# Patient Record
Sex: Male | Born: 1950
Health system: Southern US, Community
[De-identification: ages and names within clinical notes are randomized; demographics above are authoritative.]

## PROBLEM LIST (undated history)

## (undated) DIAGNOSIS — C61 Malignant neoplasm of prostate: Secondary | ICD-10-CM

## (undated) DIAGNOSIS — K219 Gastro-esophageal reflux disease without esophagitis: Secondary | ICD-10-CM

## (undated) DIAGNOSIS — N200 Calculus of kidney: Secondary | ICD-10-CM

## (undated) DIAGNOSIS — R399 Unspecified symptoms and signs involving the genitourinary system: Secondary | ICD-10-CM

## (undated) HISTORY — PX: OTHER SURGICAL HISTORY: SHX169

## (undated) HISTORY — PX: FINGER SURGERY: SHX640

## (undated) HISTORY — PX: CYSTOSCOPY W/ URETERAL STENT PLACEMENT: SHX1429

---

## 2001-01-14 ENCOUNTER — Ambulatory Visit (HOSPITAL_COMMUNITY): Admission: RE | Admit: 2001-01-14 | Discharge: 2001-01-14 | Payer: Self-pay | Admitting: Internal Medicine

## 2001-01-14 ENCOUNTER — Encounter: Payer: Self-pay | Admitting: Gastroenterology

## 2001-01-22 ENCOUNTER — Other Ambulatory Visit: Admission: RE | Admit: 2001-01-22 | Discharge: 2001-01-22 | Payer: Self-pay | Admitting: Gastroenterology

## 2001-02-02 ENCOUNTER — Ambulatory Visit (HOSPITAL_COMMUNITY): Admission: RE | Admit: 2001-02-02 | Discharge: 2001-02-02 | Payer: Self-pay | Admitting: Gastroenterology

## 2001-02-02 ENCOUNTER — Encounter: Payer: Self-pay | Admitting: Gastroenterology

## 2003-05-08 ENCOUNTER — Encounter: Payer: Self-pay | Admitting: Internal Medicine

## 2003-05-08 ENCOUNTER — Ambulatory Visit (HOSPITAL_COMMUNITY): Admission: RE | Admit: 2003-05-08 | Discharge: 2003-05-08 | Payer: Self-pay | Admitting: Internal Medicine

## 2003-05-19 ENCOUNTER — Ambulatory Visit (HOSPITAL_COMMUNITY): Admission: RE | Admit: 2003-05-19 | Discharge: 2003-05-19 | Payer: Self-pay | Admitting: Internal Medicine

## 2003-05-19 ENCOUNTER — Encounter: Payer: Self-pay | Admitting: Internal Medicine

## 2011-07-19 ENCOUNTER — Emergency Department (HOSPITAL_BASED_OUTPATIENT_CLINIC_OR_DEPARTMENT_OTHER)
Admission: EM | Admit: 2011-07-19 | Discharge: 2011-07-20 | Disposition: A | Discharge status: Home / Self Care | Payer: PRIVATE HEALTH INSURANCE | Attending: Emergency Medicine | Admitting: Emergency Medicine

## 2011-07-19 ENCOUNTER — Encounter: Payer: Self-pay | Admitting: *Deleted

## 2011-07-19 ENCOUNTER — Emergency Department (INDEPENDENT_AMBULATORY_CARE_PROVIDER_SITE_OTHER): Payer: PRIVATE HEALTH INSURANCE

## 2011-07-19 DIAGNOSIS — N200 Calculus of kidney: Secondary | ICD-10-CM | POA: Insufficient documentation

## 2011-07-19 DIAGNOSIS — K219 Gastro-esophageal reflux disease without esophagitis: Secondary | ICD-10-CM | POA: Insufficient documentation

## 2011-07-19 DIAGNOSIS — N1330 Unspecified hydronephrosis: Secondary | ICD-10-CM

## 2011-07-19 DIAGNOSIS — K7689 Other specified diseases of liver: Secondary | ICD-10-CM

## 2011-07-19 DIAGNOSIS — N201 Calculus of ureter: Secondary | ICD-10-CM

## 2011-07-19 DIAGNOSIS — R911 Solitary pulmonary nodule: Secondary | ICD-10-CM

## 2011-07-19 DIAGNOSIS — J984 Other disorders of lung: Secondary | ICD-10-CM

## 2011-07-19 DIAGNOSIS — R109 Unspecified abdominal pain: Secondary | ICD-10-CM

## 2011-07-19 HISTORY — DX: Gastro-esophageal reflux disease without esophagitis: K21.9

## 2011-07-19 LAB — URINALYSIS, ROUTINE W REFLEX MICROSCOPIC
Ketones, ur: 15 mg/dL — AB
Leukocytes, UA: NEGATIVE
Nitrite: NEGATIVE
Protein, ur: 30 mg/dL — AB
Urobilinogen, UA: 1 mg/dL (ref 0.0–1.0)

## 2011-07-19 LAB — COMPREHENSIVE METABOLIC PANEL
AST: 16 U/L (ref 0–37)
Alkaline Phosphatase: 66 U/L (ref 39–117)
BUN: 20 mg/dL (ref 6–23)
CO2: 24 mEq/L (ref 19–32)
Chloride: 101 mEq/L (ref 96–112)
Creatinine, Ser: 1.1 mg/dL (ref 0.50–1.35)
GFR calc non Af Amer: >60 mL/min (ref >60)
Potassium: 3.6 mEq/L (ref 3.5–5.1)
Total Bilirubin: 0.6 mg/dL (ref 0.3–1.2)

## 2011-07-19 LAB — DIFFERENTIAL
Basophils Absolute: 0 10*3/uL (ref 0.0–0.1)
Basophils Relative: 0 % (ref 0–1)
Eosinophils Absolute: 0.1 10*3/uL (ref 0.0–0.7)
Eosinophils Relative: 1 % (ref 0–5)
Lymphs Abs: 3.3 10*3/uL (ref 0.7–4.0)
Neutrophils Relative %: 56 % (ref 43–77)

## 2011-07-19 LAB — URINE MICROSCOPIC-ADD ON

## 2011-07-19 LAB — CBC
MCH: 31.3 pg (ref 26.0–34.0)
Platelets: 214 10*3/uL (ref 150–400)
RBC: 4.47 MIL/uL (ref 4.22–5.81)
RDW: 12.1 % (ref 11.5–15.5)

## 2011-07-19 MED ORDER — TAMSULOSIN HCL 0.4 MG PO CAPS: 0.4000 mg | ORAL_CAPSULE | Freq: Every day | ORAL | Status: DC

## 2011-07-19 MED ORDER — KETOROLAC TROMETHAMINE 30 MG/ML IJ SOLN
30.0000 mg | Freq: Once | INTRAMUSCULAR | Status: AC
  Administered 2011-07-19: 30 mg via INTRAVENOUS
  Filled 2011-07-19: qty 1

## 2011-07-19 MED ORDER — HYDROCODONE-ACETAMINOPHEN 5-500 MG PO TABS: 1.0000 | ORAL_TABLET | Freq: Four times a day (QID) | ORAL | Status: AC | PRN

## 2011-07-19 MED ORDER — IBUPROFEN 600 MG PO TABS: 600.0000 mg | ORAL_TABLET | Freq: Four times a day (QID) | ORAL | Status: DC | PRN

## 2011-07-19 MED ORDER — ONDANSETRON HCL 4 MG/2ML IJ SOLN
4.0000 mg | Freq: Once | INTRAMUSCULAR | Status: AC
  Administered 2011-07-19: 4 mg via INTRAVENOUS
  Filled 2011-07-19: qty 2

## 2011-07-19 MED ORDER — IBUPROFEN 600 MG PO TABS: 600.0000 mg | ORAL_TABLET | Freq: Four times a day (QID) | ORAL | Status: AC | PRN

## 2011-07-19 MED ORDER — MORPHINE SULFATE 4 MG/ML IJ SOLN
4.0000 mg | Freq: Once | INTRAMUSCULAR | Status: AC
  Administered 2011-07-19: 4 mg via INTRAVENOUS
  Filled 2011-07-19: qty 1

## 2011-07-19 NOTE — ED Provider Notes (Signed)
History  Scribed for Dr. Hyman Hopes, the patient was seen in room 14. The chart was scribed by Gilman Schmidt. The patients care was started at 22:30.   CSN: [id number] Arrival date & time: 07/19/2011  9:39 PM  Chief Complaint  Patient presents with  . Flank Pain   HPI Allen Tran is a 60 y.o. male who presents to the Emergency Department complaining of right sided flank pain. Pt reports that pain is intermittent and is described as a spasm. Denies any symptoms of nausea, vomiting, dysuria, hematuria, or urine frequency. Denies hematuria/dysuria/freq/urgency There are no other associated symptoms and no other alleviating or aggravating factors. No h/o renal stones. No h/o AAA  HPI ELEMENTS: Location: right side flank  Duration: consistent since onset Timing: intermittent  Severity: 10/10--> 3/10 after toradol Context: as above  Associated symptoms: denies any symptoms of nausea, vomiting, dysuria, hematuria, or urine frequency.  PAST MEDICAL HISTORY:  Past Medical History  Diagnosis Date  . GERD (gastroesophageal reflux disease)      PAST SURGICAL HISTORY:  History reviewed. No pertinent past surgical history.   MEDICATIONS:  Previous Medications   ESOMEPRAZOLE (NEXIUM) 40 MG CAPSULE    Take 40 mg by mouth 3 (three) times daily as needed. Acid reflux     ALLERGIES:  Allergies as of 07/19/2011  . (No Known Allergies)     FAMILY HISTORY:  History reviewed. No pertinent family history.   SOCIAL HISTORY: History  Substance Use Topics  . Smoking status: Never Smoker   . Smokeless tobacco: Not on file  . Alcohol Use: No       Review of Systems  Gastrointestinal: Negative for nausea and vomiting.  Genitourinary: Negative for dysuria, frequency and hematuria.  Musculoskeletal:       Flank pain  All other systems reviewed and are negative.  except noted in HPI  Physical Exam  BP 121/71  Pulse 60  Temp(Src) 96.6 F (35.9 C) (Oral)  SpO2 100%  Physical Exam  Nursing  note and vitals reviewed. Constitutional: He is oriented to person, place, and time. He appears well-developed and well-nourished.  HENT:  Head: Normocephalic.  Right Ear: External ear normal.  Left Ear: External ear normal.  Eyes: Conjunctivae are normal. Pupils are equal, round, and reactive to light.  Neck: Normal range of motion and phonation normal.  Cardiovascular: Normal rate, regular rhythm, normal heart sounds and intact distal pulses.   Pulmonary/Chest: Effort normal and breath sounds normal. He exhibits no bony tenderness.  Abdominal: Soft. Normal appearance. There is no tenderness.  Musculoskeletal: Normal range of motion. He exhibits no tenderness.  Neurological: He is alert and oriented to person, place, and time. He has normal strength. No sensory deficit.  Skin: Skin is warm, dry and intact.  Psychiatric: He has a normal mood and affect. His behavior is normal. Judgment and thought content normal.   OTHER DATA REVIEWED: Nursing notes, vital signs, and past medical records reviewed.   DIAGNOSTIC STUDIES: Oxygen Saturation is 100% on room air, normal by my interpretation.    LABS : Results for orders placed during the hospital encounter of 07/19/11  COMPREHENSIVE METABOLIC PANEL      Component Value Range   Sodium 139  135 - 145 (mEq/L)   Potassium 3.6  3.5 - 5.1 (mEq/L)   Chloride 101  96 - 112 (mEq/L)   CO2 24  19 - 32 (mEq/L)   Glucose, Bld 122 (*) 70 - 99 (mg/dL)   BUN 20  6 -  23 (mg/dL)   Creatinine, Ser 7.82  0.50 - 1.35 (mg/dL)   Calcium 9.8  8.4 - 95.6 (mg/dL)   Total Protein 7.5  6.0 - 8.3 (g/dL)   Albumin 4.4  3.5 - 5.2 (g/dL)   AST 16  0 - 37 (U/L)   ALT 8  0 - 53 (U/L)   Alkaline Phosphatase 66  39 - 117 (U/L)   Total Bilirubin 0.6  0.3 - 1.2 (mg/dL)   GFR calc non Af Amer >60  >60 (mL/min)   GFR calc Af Amer >60  >60 (mL/min)  CBC      Component Value Range   WBC 9.1  4.0 - 10.5 (K/uL)   RBC 4.47  4.22 - 5.81 (MIL/uL)   Hemoglobin 14.0  13.0 -  17.0 (g/dL)   HCT 21.3  08.6 - 57.8 (%)   MCV 87.7  78.0 - 100.0 (fL)   MCH 31.3  26.0 - 34.0 (pg)   MCHC 35.7  30.0 - 36.0 (g/dL)   RDW 46.9  62.9 - 52.8 (%)   Platelets 214  150 - 400 (K/uL)  DIFFERENTIAL      Component Value Range   Neutrophils Relative 56  43 - 77 (%)   Neutro Abs 5.1  1.7 - 7.7 (K/uL)   Lymphocytes Relative 36  12 - 46 (%)   Lymphs Abs 3.3  0.7 - 4.0 (K/uL)   Monocytes Relative 7  3 - 12 (%)   Monocytes Absolute 0.7  0.1 - 1.0 (K/uL)   Eosinophils Relative 1  0 - 5 (%)   Eosinophils Absolute 0.1  0.0 - 0.7 (K/uL)   Basophils Relative 0  0 - 1 (%)   Basophils Absolute 0.0  0.0 - 0.1 (K/uL)  URINALYSIS, ROUTINE W REFLEX MICROSCOPIC      Component Value Range   Color, Urine YELLOW  YELLOW    Appearance CLEAR  CLEAR    Specific Gravity, Urine 1.024  1.005 - 1.030    pH 6.5  5.0 - 8.0    Glucose, UA NEGATIVE  NEGATIVE (mg/dL)   Hgb urine dipstick LARGE (*) NEGATIVE    Bilirubin Urine NEGATIVE  NEGATIVE    Ketones, ur 15 (*) NEGATIVE (mg/dL)   Protein, ur 30 (*) NEGATIVE (mg/dL)   Urobilinogen, UA 1.0  0.0 - 1.0 (mg/dL)   Nitrite NEGATIVE  NEGATIVE    Leukocytes, UA NEGATIVE  NEGATIVE   URINE MICROSCOPIC-ADD ON      Component Value Range   Squamous Epithelial / LPF RARE  RARE    WBC, UA 0-2  <3 (WBC/hpf)   RBC / HPF 21-50  <3 (RBC/hpf)   Crystals CA OXALATE CRYSTALS (*) NEGATIVE    Urine-Other MUCOUS PRESENT      RADIOLOGY:  CT Abdomen     CT Abdomen Pelvis Wo Contrast (Final result)   Result time:07/19/11 2245    Final result by Rad Results In Interface (07/19/11 22:45:01)    Narrative:   *RADIOLOGY REPORT*  Clinical Data: Right flank pain for past day.  CT ABDOMEN AND PELVIS WITHOUT CONTRAST  Technique: Multidetector CT imaging of the abdomen and pelvis was performed following the standard protocol without intravenous contrast.  Comparison: Report from prior CT 05/08/2003.  Findings: 8 mm proximal right ureteral obstructing stone  with moderate right-sided hydronephrosis.  Dome of liver 1.2 cm low density structure suggestive of a cyst. Unenhanced imaging of the liver otherwise unremarkable.  Unenhanced imaging without focal splenic, pancreatic, renal, or adrenal  lesion.  1.4 mm right middle lobe pleural-based nodule (series 4 image 1). If the patient is at high risk for bronchogenic carcinoma, follow- up chest CT at 1 year is recommended. If the patient is at low risk, no follow-up is needed. This recommendation follows the consensus statement: Guidelines for Management of Small Pulmonary Nodules Detected on CT Scans: A Statement from the Fleischner Society as published in Radiology 2005; 237:395-400. Available online at: DietDisorder.cz.  Slightly prominent prostate gland. Clinical and laboratory correlation recommended. Decompressed urinary bladder.  Calcifications along the right inguinal ligament of questionable significance.  Minimal amount of free fluid adjacent to the right aspect the rectosigmoid region. No definitive findings of bowel inflammatory process otherwise noted. This fluid may be related to hydronephrosis.  No calcified gallstone.  No abdominal aortic aneurysm. No pelvic or abdominal adenopathy. No bony destructive lesion.  IMPRESSION: 8 mm proximal right ureteral obstructing stone with moderate right- sided hydronephrosis.  Small amount of fluid adjacent to the right aspect of the rectosigmoid region without other findings to suggest bowel inflammatory process. This fluid may be related to hydronephrosis.  Dome of liver 1.2 cm low density structure suggestive of a cyst.  1.4 mm right middle lobe pleural-based nodule (series 4 image 1). Follow up as noted above.  Slightly prominent prostate gland. Clinical and laboratory correlation recommended.  Original Report Authenticated By: Fuller Canada, M.D.    MDM: h/o Rt flank pain and  hematuria. CT a/p c/w 8mm renal stone, mild hydronephrosis. No UTI. Pain controlled with toradol and morphine. Will send home with strainer, flomax, norco and urology follow up. Pt aware that the stone will likely not pass on its own. Family aware of stone + liver cyst and incidental pulmonary nodule that needs CT f/u.  Precautions for return.  IMPRESSION: Diagnoses that have been ruled out:  Diagnoses that are still under consideration:  Final diagnoses:  Nephrolithiasis  Lung nodule  Liver cyst    PLAN:  Discharge home The patient is to return the emergency department if there is any worsening of symptoms. I have reviewed the discharge instructions with the patient and family  CONDITION ON DISCHARGE: stable  MEDICATIONS GIVEN IN THE E.D.  Medications  esomeprazole (NEXIUM) 40 MG capsule (not administered)  HYDROcodone-acetaminophen (VICODIN) 5-500 MG per tablet (not administered)  Tamsulosin HCl (FLOMAX) 0.4 MG CAPS (not administered)  ibuprofen (ADVIL,MOTRIN) 600 MG tablet (not administered)  ibuprofen (ADVIL,MOTRIN) 600 MG tablet (not administered)  Tamsulosin HCl (FLOMAX) 0.4 MG CAPS (not administered)  ketorolac (TORADOL) injection 30 mg (30 mg Intravenous Given 07/19/11 2200)  ondansetron (ZOFRAN) injection 4 mg (4 mg Intravenous Given 07/19/11 2200)  morphine injection 4 mg (4 mg Intravenous Given 07/19/11 2320)    DISCHARGE MEDICATIONS: New Prescriptions   HYDROCODONE-ACETAMINOPHEN (VICODIN) 5-500 MG PER TABLET    Take 1-2 tablets by mouth every 6 (six) hours as needed for pain.   IBUPROFEN (ADVIL,MOTRIN) 600 MG TABLET    Take 1 tablet (600 mg total) by mouth every 6 (six) hours as needed for pain.   TAMSULOSIN HCL (FLOMAX) 0.4 MG CAPS    Take 1 capsule (0.4 mg total) by mouth daily.    SCRIBE ATTESTATION: I personally performed the services described in this documentation, which was scribed in my presence. The recorded information has been reviewed and considered.    Stefano Gaul, MD  Procedures        Forbes Cellar, MD 07/20/11 [phone]

## 2011-07-19 NOTE — ED Notes (Signed)
Pt with right flank pain 

## 2011-07-21 ENCOUNTER — Ambulatory Visit (HOSPITAL_COMMUNITY)
Admission: RE | Admit: 2011-07-21 | Discharge: 2011-07-21 | Disposition: A | Discharge status: Home / Self Care | Payer: PRIVATE HEALTH INSURANCE | Source: Ambulatory Visit | Attending: Urology | Admitting: Urology

## 2011-07-21 DIAGNOSIS — N1330 Unspecified hydronephrosis: Secondary | ICD-10-CM | POA: Insufficient documentation

## 2011-07-21 DIAGNOSIS — N201 Calculus of ureter: Secondary | ICD-10-CM | POA: Insufficient documentation

## 2011-07-21 LAB — SURGICAL PCR SCREEN: Staphylococcus aureus: NEGATIVE

## 2011-08-04 NOTE — Op Note (Signed)
NAMEBRAYEN, BUNN             ACCOUNT NO.:  [id number]  MEDICAL RECORD NO.:  [id number]  LOCATION:  DAYL                         FACILITY:  [facility]  PHYSICIAN:  Jerilee Field, MD   DATE OF BIRTH:  [birth year]  DATE OF PROCEDURE: DATE OF DISCHARGE:                              OPERATIVE REPORT   PREOPERATIVE DIAGNOSES: 1. Right proximal ureteral stone. 2. Right hydronephrosis.  POSTOPERATIVE DIAGNOSES: 1. Right proximal ureteral stone. 2. Right hydronephrosis.  PROCEDURE:  Cystoscopy with right retrograde pyelograms and right ureteral stent placement.  SURGEON:  Jerilee Field, MD  INDICATIONS FOR PROCEDURE:  Mr. Allen Tran is a 60 year old male who developed the acute onset of right flank pain 3 days ago.  The pain has been persistent.  He has been on p.o. pain medicine, but having uncontrolled pain.  The CT scan was done, which showed an 8-mm right proximal stent with some mild hydronephrosis.  This is his first stone episode.  He denies any prior stones, GU surgery, or urinary tract infections.  He has not had any dysuria, fevers, or chills.  I discussed with the patient and his wife the nature, risks, benefits of continued medical expulsion therapy with stone massage, elective right extracorporeal shockwave lithotripsy or proceeding with cystoscopy, possible right ureteroscopy, laser, and stent.  We discussed that with this cystoscopy and ureteroscopy, I may not be able to get retrograde access and this stone may need to be treated in a staged fashion.  All questions were answered and they elected to proceed with cystoscopy with ureteroscopy, laser, and stent placement.  FINDINGS:  On fluoro, there was a faint calcification in the right ureteropelvic junction area.  Stone actually appeared to be in the midureter and on retrograde injection was confirmed to be in the right proximal ureter and with review of the CT, the right kidney was just noted to be low lying in  the right abdomen.  With retrograde injection of contrast and a normal ureter all the way up to a filling defect in the right proximal ureter and outlining mild dilation of the renal pelvis, upper, middle and lower calyces.  Interestingly, contrast did not drain past the stone until the stent was placed.  I could feel the stent passing the stone as it went into the renal pelvis and sounded out past the stone with mild resistance, but no difficulty getting the stent in place.  A good coil was seen in the kidney and a good coil in the bladder.  DESCRIPTION OF PROCEDURE:  Consent was obtained, the patient brought into the operating room.  A time-out was performed to confirm the patient and procedure.  After adequate anesthesia, preop antibiotics and SCDs, the patient was placed in lithotomy and prepped and draped in a usual fashion.  A rigid cystoscope was passed per urethra and right ureteral orifice was visualized.  Scout imaging was obtained of the right KUB and a 5-French open-ended catheter was used to obtain right retrograde with retrograde injection of contrast as previously dictated. Next, a sensor wire was advanced up and coiled in the upper pole of the right kidney and into the dual-lumen exchange catheter, advanced this over the wire after the cystoscope  was removed.  The dual-lumen would not advance through the ureterovesical junction.  I did try to turn the catheter and lubricated to get it down to access right ureter and that the right ureter was simply nondilated and would not accept any instrumentation.  Given that the stone was all the way up in the right ureter with some evidence of impaction and lack of contrast drainage from the right kidney to that stone, I thought it was best to leave a ureteral stent as we discussed and then plan for further ureteroscopy or shock waves depending on how the stone looks on repeat imaging. Therefore, a 6 x 24 cm stent was advanced over  the wire after it was back-loaded on the cystoscope.  I could feel the stent past the stone in the proximal ureter and go up into the kidney.  The wire was removed, a good coil was seen in the kidney and a good coil in the bladder.  At this point, contrast began to drain through the holes of the stent.  The patient's bladder was drained and the scope was removed.  He was then awakened, taken to recovery room in stable condition.  SPECIMENS:  None.  ESTIMATED BLOOD LOSS:  Zero.  COMPLICATIONS:  None.  The patient was stable to PACU.          ______________________________ Jerilee Field, MD     ME/MEDQ  D:  07/21/2011  T:  07/22/2011  Job:  [id number]  Electronically Signed by Jerilee Field MD on 08/04/2011 12:35:52 PM

## 2011-08-08 ENCOUNTER — Ambulatory Visit (HOSPITAL_BASED_OUTPATIENT_CLINIC_OR_DEPARTMENT_OTHER)
Admission: RE | Admit: 2011-08-08 | Discharge: 2011-08-08 | Disposition: A | Discharge status: Home / Self Care | Payer: 59 | Source: Ambulatory Visit | Attending: Urology | Admitting: Urology

## 2011-08-08 DIAGNOSIS — R9431 Abnormal electrocardiogram [ECG] [EKG]: Secondary | ICD-10-CM | POA: Insufficient documentation

## 2011-08-08 DIAGNOSIS — N201 Calculus of ureter: Secondary | ICD-10-CM | POA: Insufficient documentation

## 2011-08-08 LAB — POCT HEMOGLOBIN-HEMACUE: Hemoglobin: 13.9 g/dL (ref 13.0–17.0)

## 2011-08-27 NOTE — Op Note (Signed)
Allen Tran, Allen Tran             ACCOUNT NO.:  [id number]  MEDICAL RECORD NO.:  [id number]  LOCATION:                                 FACILITY:  PHYSICIAN:  Jerilee Field, MD   DATE OF BIRTH:  September 12, 1951  DATE OF PROCEDURE:  08/08/2011 DATE OF DISCHARGE:                              OPERATIVE REPORT   PREOPERATIVE DIAGNOSIS:  Right ureteral stone.  POSTOPERATIVE DIAGNOSIS:  Right ureteral stone.  PROCEDURE:  Right ureteroscopy, laser lithotripsy, stone basket extraction, and right ureteral stent with string placement.  SURGEON:  Jerilee Field, MD  INDICATIONS FOR PROCEDURE:  Allen Tran is a 60 year old male who underwent prior cystoscopy with ureteral stent placement, was unable to get retrograde access and therefore he returns today for definitive stone management.  I discussed with the patient and his wife (interpreter) the nature, risks, benefits, and alternatives to cystoscopy, right ureteroscopy, stone basket extraction, and laser lithotripsy including nontreatment and extracorporeal shock wave lithotripsy.  All questions were answered.  The patient elected to proceed with ureteroscopic and laser lithotripsy.  His preop KUB, the stone has been difficult to see on KUB, and was thought to possibly be by the stent coil.  FINDINGS:  On scout imaging on fluoro, the stent was in good position on the right and there were no obvious calcifications in the right kidney or adjacent to the stent.  Once the stent was removed and a wire was placed, there was a faint calcification at the site of the stone on CT and indeed the stone was found there on ureteroscopic exam.  All stone fragments were removed apart from one possible fragment that was only as big as the tip of the wire and could not be basketed.  DESCRIPTION OF PROCEDURE:  After consent was obtained, the patient brought to the operating room.  A time-out was performed to confirm the patient and procedure.   Antibiotics and SCDs were in place.  He was placed in lithotomy and prepped and draped in the usual fashion.  Rigid cystoscope was passed per urethra and the right stent was visualized, it was grasped and removed through the meatus.  Sensor wire was advanced through the stent.  As the sensor wire approached proximal ureter, it deflected off the stone and the stone was sounded out by the wire in the left proximal ureter at the site of a faint calcification on fluoro. The wire then proceeded to coil in the middle pole of the kidney.  A semi-rigid ureteroscope was advanced in the distal ureter to ensure it was stone-free up to the iliacs where I could no longer proceed, therefore the semi-rigid was used to place a 2nd sensor wire which was coiled in the kidney and the semi-rigid scope was removed.  Over the 2nd wire, a 35 cm ureteral access sheath was placed without difficulty and this set up just above the iliac vessels.  The inner cannula was removed and the digital ureteroscope was advanced up to the right proximal ureter where a large sharp jagged stone was seen embedded in the right ureteral wall.  A 3 cc of BackStop was placed above the stone by passing the stone with the scope  and the BackStop catheter.  Once the BackStop was in position, I came more distal to the stone and after passing the stone, it did break off 2 small pieces which were basketed with a Gemini basket and removed.  The remaining stone was still too large to get into the [facility] and appeared to large to safely removed down the ureter. Therefore, a 200 micron fiber holmium laser was passed, setting of 0.5 and 15 Hz, the stone was hit just for a short time and shattered.  All the pieces were removed.  The BackStop actually washed into the kidney where four 1 mm pieces washed.  Three of these pieces were able to be basketed out with the nitinol ZeroTip and a Gemini combinations, but there was 1 small fragment that could  not be obtained and it was only as big as the tip of the wire, therefore I left it in place.  The upper middle and lower poles were carefully inspected, there were no other stones.  The access sheath was backed out on the ureteroscope and the entire ureter was inspected and noted to be stone-free and free of injury apart from some erythematous and pale mucosa where the stone had been imbedded.  The scope was removed and the wire was back loaded on the cystoscope and a 6 x 26 cm stent was placed.  The wire was removed. A good coil was seen in the kidney, although it was upside down and good coil in the bladder.  The patient's bladder was drained and scope was removed.  The string was left on the stent, so we can remove that next week.  SPECIMEN:  Stone fragments to Pathology.  ESTIMATED BLOOD LOSS:  Minimal.  COMPLICATIONS:  None.  DISPOSITION:  The patient stable to PACU.          ______________________________ Jerilee Field, MD     ME/MEDQ  D:  08/08/2011  T:  08/08/2011  Job:  [id number]  Electronically Signed by Jerilee Field MD on 08/27/2011 01:04:05 PM

## 2012-02-14 ENCOUNTER — Emergency Department (INDEPENDENT_AMBULATORY_CARE_PROVIDER_SITE_OTHER): Payer: 59

## 2012-02-14 ENCOUNTER — Encounter (HOSPITAL_BASED_OUTPATIENT_CLINIC_OR_DEPARTMENT_OTHER): Payer: Self-pay | Admitting: *Deleted

## 2012-02-14 ENCOUNTER — Emergency Department (HOSPITAL_BASED_OUTPATIENT_CLINIC_OR_DEPARTMENT_OTHER)
Admission: EM | Admit: 2012-02-14 | Discharge: 2012-02-15 | Disposition: A | Discharge status: Home / Self Care | Payer: 59 | Attending: Emergency Medicine | Admitting: Emergency Medicine

## 2012-02-14 DIAGNOSIS — R10813 Right lower quadrant abdominal tenderness: Secondary | ICD-10-CM | POA: Insufficient documentation

## 2012-02-14 DIAGNOSIS — K219 Gastro-esophageal reflux disease without esophagitis: Secondary | ICD-10-CM | POA: Insufficient documentation

## 2012-02-14 DIAGNOSIS — Z87442 Personal history of urinary calculi: Secondary | ICD-10-CM | POA: Insufficient documentation

## 2012-02-14 DIAGNOSIS — M76899 Other specified enthesopathies of unspecified lower limb, excluding foot: Secondary | ICD-10-CM

## 2012-02-14 DIAGNOSIS — K7689 Other specified diseases of liver: Secondary | ICD-10-CM

## 2012-02-14 DIAGNOSIS — R1031 Right lower quadrant pain: Secondary | ICD-10-CM | POA: Insufficient documentation

## 2012-02-14 HISTORY — DX: Calculus of kidney: N20.0

## 2012-02-14 LAB — CBC
Hemoglobin: 13.3 g/dL (ref 13.0–17.0)
MCH: 30.9 pg (ref 26.0–34.0)
MCHC: 34.5 g/dL (ref 30.0–36.0)
Platelets: 239 10*3/uL (ref 150–400)
RBC: 4.3 MIL/uL (ref 4.22–5.81)

## 2012-02-14 LAB — COMPREHENSIVE METABOLIC PANEL
ALT: 13 U/L (ref 0–53)
Albumin: 4 g/dL (ref 3.5–5.2)
Alkaline Phosphatase: 67 U/L (ref 39–117)
Chloride: 105 mEq/L (ref 96–112)
Potassium: 3.7 mEq/L (ref 3.5–5.1)
Sodium: 140 mEq/L (ref 135–145)
Total Bilirubin: 0.3 mg/dL (ref 0.3–1.2)
Total Protein: 6.7 g/dL (ref 6.0–8.3)

## 2012-02-14 LAB — URINALYSIS, ROUTINE W REFLEX MICROSCOPIC
Bilirubin Urine: NEGATIVE
Leukocytes, UA: NEGATIVE
Nitrite: NEGATIVE
Specific Gravity, Urine: 1.013 (ref 1.005–1.030)
Urobilinogen, UA: 0.2 mg/dL (ref 0.0–1.0)
pH: 7.5 (ref 5.0–8.0)

## 2012-02-14 LAB — DIFFERENTIAL
Basophils Relative: 0 % (ref 0–1)
Eosinophils Absolute: 0.1 10*3/uL (ref 0.0–0.7)
Lymphs Abs: 1.5 10*3/uL (ref 0.7–4.0)
Monocytes Relative: 7 % (ref 3–12)
Neutro Abs: 5.4 10*3/uL (ref 1.7–7.7)
Neutrophils Relative %: 72 % (ref 43–77)

## 2012-02-14 MED ORDER — ONDANSETRON HCL 4 MG/2ML IJ SOLN
4.0000 mg | Freq: Once | INTRAMUSCULAR | Status: AC
  Administered 2012-02-14: 4 mg via INTRAVENOUS
  Filled 2012-02-14: qty 2

## 2012-02-14 MED ORDER — SODIUM CHLORIDE 0.9 % IV BOLUS (SEPSIS)
1000.0000 mL | Freq: Once | INTRAVENOUS | Status: AC
  Administered 2012-02-14: 1000 mL via INTRAVENOUS

## 2012-02-14 MED ORDER — HYDROMORPHONE HCL PF 1 MG/ML IJ SOLN
1.0000 mg | Freq: Once | INTRAMUSCULAR | Status: AC
  Administered 2012-02-14: 1 mg via INTRAVENOUS
  Filled 2012-02-14: qty 1

## 2012-02-14 NOTE — ED Provider Notes (Signed)
History    This chart was scribed for Allen Quarry, MD, MD by Smitty Pluck. The patient was seen in room MH04 and the patient's care was started at 9:35PM.   CSN: [id number]  Arrival date & time 02/14/12  2102   First MD Initiated Contact with Patient 02/14/12 2126      Chief Complaint  Patient presents with  . Abdominal Pain    (Consider location/radiation/quality/duration/timing/severity/associated sxs/prior treatment) The history is provided by the patient.   [name] is a 61 y.o. male who presents to the Emergency Department complaining of moderate RLQ pain onset today 2-3 hours ago. The pain started suddenly. The pain is sharp. He has chills. Denies dysuria, vomiting, blood in urine, increased urinary frequency. Pt reports having similar pain in the past. Pt had surgery for kidney stones in October 2012. He states that this does not feel like kidney stones. The symptoms have been constant since onset without radiation.   Past Medical History  Diagnosis Date  . GERD (gastroesophageal reflux disease)   . Kidney stone     Past Surgical History  Procedure Date  . Kidney stone surgery     History reviewed. No pertinent family history.  History  Substance Use Topics  . Smoking status: Never Smoker   . Smokeless tobacco: Not on file  . Alcohol Use: No      Review of Systems  All other systems reviewed and are negative.   10 Systems reviewed and all are negative for acute change except as noted in the HPI.   Allergies  Review of patient's allergies indicates no known allergies.  Home Medications   Current Outpatient Rx  Name Route Sig Dispense Refill  . ESOMEPRAZOLE MAGNESIUM 40 MG PO CPDR Oral Take 40 mg by mouth 3 (three) times daily as needed. Acid reflux    . TAMSULOSIN HCL 0.4 MG PO CAPS Oral Take 1 capsule (0.4 mg total) by mouth daily. 15 capsule 0    BP 164/95  Pulse 82  Temp(Src) 98.4 F (36.9 C) (Oral)  Resp 20  Ht 5\' 9"  (1.753 m)  Wt  170 lb (77.111 kg)  BMI 25.10 kg/m2  SpO2 98%  Physical Exam  Nursing note and vitals reviewed. Constitutional: He is oriented to person, place, and time. He appears well-developed and well-nourished. No distress.  HENT:  Head: Normocephalic and atraumatic.  Eyes: Conjunctivae are normal. Pupils are equal, round, and reactive to light.  Neck: Normal range of motion. Neck supple.  Cardiovascular: Normal rate, regular rhythm and normal heart sounds.   Pulmonary/Chest: Effort normal and breath sounds normal. No respiratory distress.  Abdominal: Soft. He exhibits no distension. There is tenderness (RLQ). There is no rebound and no guarding.  Neurological: He is alert and oriented to person, place, and time.  Skin: Skin is warm and dry.  Psychiatric: He has a normal mood and affect. His behavior is normal.    ED Course  Procedures (including critical care time) DIAGNOSTIC STUDIES: Oxygen Saturation is 98% on room air, normal by my interpretation.    COORDINATION OF CARE: 9:40PM EDP discusses pt ED treatment with pt.    Labs Reviewed  URINALYSIS, ROUTINE W REFLEX MICROSCOPIC   No results found.   No diagnosis found.    MDM  Patient with resolution of his pain with a medicine. He has no evidence of kidney stone or appendicitis on CT of abdomen. His labs are normal. He is discharged home in improved condition  I personally performed the services described in this documentation, which was scribed in my presence. The recorded information has been reviewed and considered.   Allen Quarry, MD 02/14/12 [phone]

## 2012-02-14 NOTE — Discharge Instructions (Signed)

## 2012-02-14 NOTE — ED Notes (Signed)
Pt c/o sudden onset RLQ pain and chills a couple of hours ago. Denies other s/s.

## 2016-06-17 DIAGNOSIS — R1031 Right lower quadrant pain: Secondary | ICD-10-CM | POA: Insufficient documentation

## 2016-09-10 DIAGNOSIS — R933 Abnormal findings on diagnostic imaging of other parts of digestive tract: Secondary | ICD-10-CM | POA: Insufficient documentation

## 2016-09-10 DIAGNOSIS — Z86010 Personal history of colonic polyps: Secondary | ICD-10-CM | POA: Insufficient documentation

## 2016-12-01 ENCOUNTER — Encounter (HOSPITAL_BASED_OUTPATIENT_CLINIC_OR_DEPARTMENT_OTHER): Payer: Self-pay

## 2016-12-01 ENCOUNTER — Emergency Department (HOSPITAL_BASED_OUTPATIENT_CLINIC_OR_DEPARTMENT_OTHER)
Admission: EM | Admit: 2016-12-01 | Discharge: 2016-12-01 | Disposition: A | Discharge status: Home / Self Care | Payer: PRIVATE HEALTH INSURANCE | Attending: Emergency Medicine | Admitting: Emergency Medicine

## 2016-12-01 DIAGNOSIS — R109 Unspecified abdominal pain: Secondary | ICD-10-CM | POA: Insufficient documentation

## 2016-12-01 DIAGNOSIS — M545 Low back pain, unspecified: Secondary | ICD-10-CM

## 2016-12-01 DIAGNOSIS — Z79899 Other long term (current) drug therapy: Secondary | ICD-10-CM | POA: Diagnosis not present

## 2016-12-01 LAB — CBC WITH DIFFERENTIAL/PLATELET
BASOS PCT: 0 %
Basophils Absolute: 0 10*3/uL (ref 0.0–0.1)
EOS PCT: 0 %
Eosinophils Absolute: 0 10*3/uL (ref 0.0–0.7)
HEMATOCRIT: 40.1 % (ref 39.0–52.0)
Hemoglobin: 13.5 g/dL (ref 13.0–17.0)
Lymphocytes Relative: 16 %
Lymphs Abs: 1.8 10*3/uL (ref 0.7–4.0)
MCH: 30.7 pg (ref 26.0–34.0)
MCHC: 33.7 g/dL (ref 30.0–36.0)
MCV: 91.1 fL (ref 78.0–100.0)
MONO ABS: 0.6 10*3/uL (ref 0.1–1.0)
MONOS PCT: 5 %
NEUTROS ABS: 8.9 10*3/uL — AB (ref 1.7–7.7)
Neutrophils Relative %: 79 %
Platelets: 252 10*3/uL (ref 150–400)
RBC: 4.4 MIL/uL (ref 4.22–5.81)
RDW: 12.7 % (ref 11.5–15.5)
WBC: 11.4 10*3/uL — ABNORMAL HIGH (ref 4.0–10.5)

## 2016-12-01 LAB — URINALYSIS, MICROSCOPIC (REFLEX)

## 2016-12-01 LAB — URINALYSIS, ROUTINE W REFLEX MICROSCOPIC
Glucose, UA: NEGATIVE mg/dL
Hgb urine dipstick: NEGATIVE
Ketones, ur: NEGATIVE mg/dL
Leukocytes, UA: NEGATIVE
NITRITE: NEGATIVE
Protein, ur: 30 mg/dL — AB
SPECIFIC GRAVITY, URINE: 1.026 (ref 1.005–1.030)
pH: 6 (ref 5.0–8.0)

## 2016-12-01 LAB — BASIC METABOLIC PANEL
Anion gap: 7 (ref 5–15)
BUN: 19 mg/dL (ref 6–20)
CALCIUM: 9.1 mg/dL (ref 8.9–10.3)
CO2: 28 mmol/L (ref 22–32)
CREATININE: 1 mg/dL (ref 0.61–1.24)
Chloride: 103 mmol/L (ref 101–111)
GFR calc Af Amer: >60 mL/min (ref >60)
GFR calc non Af Amer: >60 mL/min (ref >60)
GLUCOSE: 97 mg/dL (ref 65–99)
Potassium: 3.8 mmol/L (ref 3.5–5.1)
Sodium: 138 mmol/L (ref 135–145)

## 2016-12-01 MED ORDER — METHOCARBAMOL 500 MG PO TABS: 500.0000 mg | ORAL_TABLET | Freq: Three times a day (TID) | ORAL | 0 refills | Status: DC | PRN

## 2016-12-01 MED FILL — METHOCARBAMOL 500 MG TABLET: 500 | 3 days supply | Qty: 10 | Fill #0

## 2016-12-01 NOTE — ED Provider Notes (Signed)
[facility] DEPT [facility] Provider Note   CSN: [id number] Arrival date & time: 12/01/16  1140     History   Chief Complaint Chief Complaint  Patient presents with  . Abdominal Pain    HPI Allen Tran is a 66 y.o. male.  HPI  Patient has had midline lower back pain for the last week. Worse with certain movements and certain positions. Worse with lying down. Patient speaks some English was also translated by his wife. No dysuria. No fevers. Starts in his low back and does progress his way around the front of the abdomen. No trauma. No loss of bladder bowel control. No weakness. Has had previous abdominal hemostasis feels different. No nausea vomiting or diarrhea. No fevers. No rash.   Past Medical History:  Diagnosis Date  . GERD (gastroesophageal reflux disease)   . Kidney stone     There are no active problems to display for this patient.   Past Surgical History:  Procedure Laterality Date  . KIDNEY STONE SURGERY         Home Medications    Prior to Admission medications   Medication Sig Start Date End Date Taking? Authorizing Provider  Lansoprazole (PREVACID PO) Take by mouth.   Yes Historical Provider, MD  esomeprazole (NEXIUM) 40 MG capsule Take 40 mg by mouth 3 (three) times daily as needed. Acid reflux    Historical Provider, MD  methocarbamol (ROBAXIN) 500 MG tablet Take 1 tablet (500 mg total) by mouth every 8 (eight) hours as needed for muscle spasms. 12/01/16   Davonna Belling, MD    Family History No family history on file.  Social History Social History  Substance Use Topics  . Smoking status: Never Smoker  . Smokeless tobacco: Never Used  . Alcohol use No     Allergies   Patient has no known allergies.   Review of Systems Review of Systems  Constitutional: Negative for appetite change.  HENT: Negative for congestion.   Respiratory: Negative for shortness of breath.   Cardiovascular: Negative for chest pain.  Gastrointestinal:  Positive for abdominal pain.  Genitourinary: Negative for dysuria, penile swelling, scrotal swelling and testicular pain.  Musculoskeletal: Positive for back pain. Negative for gait problem and joint swelling.  Neurological: Negative for weakness and numbness.  Hematological: Negative for adenopathy.  Psychiatric/Behavioral: Negative for confusion.     Physical Exam Updated Vital Signs BP (!) 147/103 (BP Location: Left Arm)   Pulse 80   Temp 98.2 F (36.8 C) (Oral)   Resp 18   Ht 5\' 10"  (1.778 m)   Wt 167 lb (75.8 kg)   SpO2 100%   BMI 23.96 kg/m   Physical Exam  Constitutional: He appears well-developed.  HENT:  Head: Atraumatic.  Eyes: EOM are normal.  Cardiovascular: Normal rate.   Pulmonary/Chest: Effort normal.  Abdominal: Soft. He exhibits no mass. There is no guarding.  Musculoskeletal:  Lumbar paraspinal tenderness. Some pain with straight leg raise, mildly worse on right. Peroneal sensation intact. No perineal tenderness. Strength intact in both feet. Sensation intact both feet.  Neurological: He is alert.  Skin: Skin is warm. Capillary refill takes less than 2 seconds.     ED Treatments / Results  Labs (all labs ordered are listed, but only abnormal results are displayed) Labs Reviewed  URINALYSIS, ROUTINE W REFLEX MICROSCOPIC - Abnormal; Notable for the following:       Result Value   APPearance CLOUDY (*)    Bilirubin Urine SMALL (*)  Protein, ur 30 (*)    All other components within normal limits  URINALYSIS, MICROSCOPIC (REFLEX) - Abnormal; Notable for the following:    Bacteria, UA MANY (*)    Squamous Epithelial / LPF 0-5 (*)    All other components within normal limits  CBC WITH DIFFERENTIAL/PLATELET - Abnormal; Notable for the following:    WBC 11.4 (*)    Neutro Abs 8.9 (*)    All other components within normal limits  URINE CULTURE  BASIC METABOLIC PANEL    EKG  EKG Interpretation None       Radiology No results  found.  Procedures Procedures (including critical care time)  Medications Ordered in ED Medications - No data to display   Initial Impression / Assessment and Plan / ED Course  I have reviewed the triage vital signs and the nursing notes.  Pertinent labs & imaging results that were available during my care of the patient were reviewed by me and considered in my medical decision making (see chart for details).      low back pain that goes to his abdomen. Benign abdominal exam. No red flags. Some pain with straight leg raise. White count minimally elevated. Has some  Activity in the urine without white cells. No urinary symptoms. We'll treat as muscle skeletal back pain. Pain is improved after taking the pressure off his back he says. Will discharge home to follow-up as needed.  Final Clinical Impressions(s) / ED Diagnoses   Final diagnoses:  Acute midline low back pain without sciatica    New Prescriptions New Prescriptions   METHOCARBAMOL (ROBAXIN) 500 MG TABLET    Take 1 tablet (500 mg total) by mouth every 8 (eight) hours as needed for muscle spasms.     Davonna Belling, MD 12/01/16 [phone]

## 2016-12-01 NOTE — ED Triage Notes (Signed)
C/o lower abd/lower back pain x 1 week-worse x yesterday-NAD-steady gait-wife/interpreter for pt

## 2016-12-02 LAB — URINE CULTURE: Culture: <10000 — AB

## 2018-09-17 ENCOUNTER — Other Ambulatory Visit: Payer: Self-pay | Admitting: Urology

## 2018-09-17 DIAGNOSIS — C61 Malignant neoplasm of prostate: Secondary | ICD-10-CM

## 2018-10-01 ENCOUNTER — Other Ambulatory Visit: Payer: Self-pay | Admitting: Urology

## 2018-10-01 ENCOUNTER — Ambulatory Visit (HOSPITAL_COMMUNITY)
Admission: RE | Admit: 2018-10-01 | Discharge: 2018-10-01 | Disposition: A | Discharge status: Home / Self Care | Payer: PRIVATE HEALTH INSURANCE | Source: Ambulatory Visit

## 2018-10-01 ENCOUNTER — Ambulatory Visit (HOSPITAL_COMMUNITY)
Admission: RE | Admit: 2018-10-01 | Discharge: 2018-10-01 | Disposition: A | Discharge status: Home / Self Care | Payer: PRIVATE HEALTH INSURANCE | Source: Ambulatory Visit | Attending: Urology | Admitting: Urology

## 2018-10-01 ENCOUNTER — Encounter (HOSPITAL_COMMUNITY)
Admission: RE | Admit: 2018-10-01 | Discharge: 2018-10-01 | Disposition: A | Discharge status: Home / Self Care | Payer: PRIVATE HEALTH INSURANCE | Source: Ambulatory Visit | Attending: Urology | Admitting: Urology

## 2018-10-01 DIAGNOSIS — C61 Malignant neoplasm of prostate: Secondary | ICD-10-CM

## 2018-10-01 MED ORDER — TECHNETIUM TC 99M MEDRONATE IV KIT: 22.0000 | PACK | Freq: Once | INTRAVENOUS | Status: DC | PRN

## 2018-10-01 MED ORDER — TECHNETIUM TC 99M MEDRONATE IV KIT
22.0000 | PACK | Freq: Once | INTRAVENOUS | Status: AC | PRN
  Administered 2018-10-01: 22 via INTRAVENOUS

## 2018-10-11 ENCOUNTER — Other Ambulatory Visit (HOSPITAL_COMMUNITY): Payer: PRIVATE HEALTH INSURANCE

## 2018-10-14 ENCOUNTER — Other Ambulatory Visit: Payer: Self-pay | Admitting: Urology

## 2018-11-24 ENCOUNTER — Encounter (HOSPITAL_COMMUNITY): Payer: Self-pay

## 2018-11-24 NOTE — Patient Instructions (Signed)
Youcef Klas  10-07-1951    Your procedure is scheduled on:  12-06-2018   Report to [facility] Main  Entrance, Report to admitting at  5:30 AM    Call this number if you have problems the morning of surgery [phone]       Remember: Do not eat food or drink liquids :After Midnight.                                      BRUSH YOUR TEETH MORNING OF SURGERY AND RINSE YOUR MOUTH OUT, NO CHEWING GUM CANDY OR MINTS.         Take these medicines the morning of surgery with A SIP OF WATER:   Nexium                                    You may not have any metal on your body including hair pins and piercings              Do not wear jewelry, make-up, lotions, powders or perfumes, deodorant                        Men may shave face and neck.        Do not bring valuables to the hospital. [place].  Contacts, dentures or bridgework may not be worn into surgery.  Leave suitcase in the car. After surgery it may be brought to your room.    _____________________________________________________________________             [facility] - Preparing for Surgery Before surgery, you can play an important role.  Because skin is not sterile, your skin needs to be as free of germs as possible.  You can reduce the number of germs on your skin by washing with CHG (chlorahexidine gluconate) soap before surgery.  CHG is an antiseptic cleaner which kills germs and bonds with the skin to continue killing germs even after washing. Please DO NOT use if you have an allergy to CHG or antibacterial soaps.  If your skin becomes reddened/irritated stop using the CHG and inform your nurse when you arrive at Short Stay. Do not shave (including legs and underarms) for at least 48 hours prior to the first CHG shower.  You may shave your face/neck. Please follow these instructions carefully:  1.  Shower with CHG Soap the  night before surgery and the  morning of Surgery.  2.  If you choose to wash your hair, wash your hair first as usual with your  normal  shampoo.  3.  After you shampoo, rinse your hair and body thoroughly to remove the  shampoo.                            4.  Use CHG as you would any other liquid soap.  You can apply chg directly  to the skin and wash                       Gently with a scrungie or  clean washcloth.  5.  Apply the CHG Soap to your body ONLY FROM THE NECK DOWN.   Do not use on face/ open                           Wound or open sores. Avoid contact with eyes, ears mouth and genitals (private parts).                       Wash face,  Genitals (private parts) with your normal soap.             6.  Wash thoroughly, paying special attention to the area where your surgery  will be performed.  7.  Thoroughly rinse your body with warm water from the neck down.  8.  DO NOT shower/wash with your normal soap after using and rinsing off  the CHG Soap.             9.  Pat yourself dry with a clean towel.            10.  Wear clean pajamas.            11.  Place clean sheets on your bed the night of your first shower and do not  sleep with pets. Day of Surgery : Do not apply any lotions/deodorants the morning of surgery.  Please wear clean clothes to the hospital/surgery center.  FAILURE TO FOLLOW THESE INSTRUCTIONS MAY RESULT IN THE CANCELLATION OF YOUR SURGERY PATIENT SIGNATURE_________________________________  NURSE SIGNATURE__________________________________  ________________________________________________________________________

## 2018-11-29 ENCOUNTER — Encounter (HOSPITAL_COMMUNITY)
Admission: RE | Admit: 2018-11-29 | Discharge: 2018-11-29 | Disposition: A | Discharge status: Home / Self Care | Payer: PRIVATE HEALTH INSURANCE | Source: Ambulatory Visit | Attending: Urology | Admitting: Urology

## 2018-11-29 ENCOUNTER — Other Ambulatory Visit: Payer: Self-pay

## 2018-11-29 ENCOUNTER — Encounter (HOSPITAL_COMMUNITY): Payer: Self-pay

## 2018-11-29 DIAGNOSIS — Z01812 Encounter for preprocedural laboratory examination: Secondary | ICD-10-CM | POA: Insufficient documentation

## 2018-11-29 HISTORY — DX: Malignant neoplasm of prostate: C61

## 2018-11-29 HISTORY — DX: Unspecified symptoms and signs involving the genitourinary system: R39.9

## 2018-11-29 LAB — CBC
HCT: 45 % (ref 39.0–52.0)
Hemoglobin: 14.4 g/dL (ref 13.0–17.0)
MCH: 30.9 pg (ref 26.0–34.0)
MCHC: 32 g/dL (ref 30.0–36.0)
MCV: 96.6 fL (ref 80.0–100.0)
Platelets: 243 10*3/uL (ref 150–400)
RBC: 4.66 MIL/uL (ref 4.22–5.81)
RDW: 12.6 % (ref 11.5–15.5)
WBC: 6.4 10*3/uL (ref 4.0–10.5)
nRBC: 0 % (ref 0.0–0.2)

## 2018-11-29 LAB — PROTIME-INR
INR: 0.92
Prothrombin Time: 12.3 seconds (ref 11.4–15.2)

## 2018-11-29 LAB — BASIC METABOLIC PANEL
Anion gap: 7 (ref 5–15)
BUN: 13 mg/dL (ref 8–23)
CHLORIDE: 103 mmol/L (ref 98–111)
CO2: 29 mmol/L (ref 22–32)
Calcium: 9.3 mg/dL (ref 8.9–10.3)
Creatinine, Ser: 0.97 mg/dL (ref 0.61–1.24)
GFR calc Af Amer: >60 mL/min (ref >60)
GFR calc non Af Amer: >60 mL/min (ref >60)
Glucose, Bld: 110 mg/dL — ABNORMAL HIGH (ref 70–99)
Potassium: 4.5 mmol/L (ref 3.5–5.1)
Sodium: 139 mmol/L (ref 135–145)

## 2018-11-29 LAB — ABO/RH: ABO/RH(D): A POS

## 2018-11-29 NOTE — Progress Notes (Signed)
Pt seen today at PAT appointment for surgery on 12-06-2018.  [place] interpreter, Claris Che, interpreted for pt during viist.

## 2018-12-05 ENCOUNTER — Encounter (HOSPITAL_COMMUNITY): Payer: Self-pay | Admitting: Anesthesiology

## 2018-12-05 NOTE — Anesthesia Preprocedure Evaluation (Addendum)
Anesthesia Evaluation  Patient identified by MRN, date of birth, ID band Patient awake    Reviewed: Allergy & Precautions, NPO status , Patient's Chart, lab work & pertinent test results  Airway Mallampati: II       Dental no notable dental hx. (+) Teeth Intact   Pulmonary neg pulmonary ROS,    Pulmonary exam normal breath sounds clear to auscultation       Cardiovascular negative cardio ROS Normal cardiovascular exam Rhythm:Regular Rate:Normal     Neuro/Psych negative neurological ROS  negative psych ROS   GI/Hepatic Neg liver ROS, GERD  Medicated and Controlled,  Endo/Other  negative endocrine ROS  Renal/GU      Musculoskeletal negative musculoskeletal ROS (+)   Abdominal Normal abdominal exam  (+)   Peds  Hematology negative hematology ROS (+)   Anesthesia Other Findings   Reproductive/Obstetrics                            Anesthesia Physical Anesthesia Plan  ASA: II  Anesthesia Plan: General   Post-op Pain Management:    Induction: Intravenous  PONV Risk Score and Plan: 4 or greater and Ondansetron and Dexamethasone  Airway Management Planned: Oral ETT  Additional Equipment:   Intra-op Plan:   Post-operative Plan: Extubation in OR  Informed Consent: I have reviewed the patients History and Physical, chart, labs and discussed the procedure including the risks, benefits and alternatives for the proposed anesthesia with the patient or authorized representative who has indicated his/her understanding and acceptance.     Dental advisory given  Plan Discussed with: CRNA  Anesthesia Plan Comments:        Anesthesia Quick Evaluation

## 2018-12-06 ENCOUNTER — Inpatient Hospital Stay (HOSPITAL_COMMUNITY)
Admission: AD | Admit: 2018-12-06 | Discharge: 2018-12-11 | DRG: 707 | Disposition: A | Discharge status: Home / Self Care | Payer: PRIVATE HEALTH INSURANCE | Attending: Urology | Admitting: Urology

## 2018-12-06 ENCOUNTER — Encounter (HOSPITAL_COMMUNITY): Payer: Self-pay

## 2018-12-06 ENCOUNTER — Encounter (HOSPITAL_COMMUNITY)
Admission: AD | Disposition: A | Discharge status: Home / Self Care | Payer: Self-pay | Source: Home / Self Care | Attending: Urology

## 2018-12-06 ENCOUNTER — Ambulatory Visit (HOSPITAL_COMMUNITY): Payer: PRIVATE HEALTH INSURANCE | Admitting: Anesthesiology

## 2018-12-06 ENCOUNTER — Other Ambulatory Visit: Payer: Self-pay

## 2018-12-06 ENCOUNTER — Ambulatory Visit (HOSPITAL_COMMUNITY): Payer: PRIVATE HEALTH INSURANCE | Admitting: Physician Assistant

## 2018-12-06 DIAGNOSIS — Z79899 Other long term (current) drug therapy: Secondary | ICD-10-CM

## 2018-12-06 DIAGNOSIS — N3289 Other specified disorders of bladder: Secondary | ICD-10-CM | POA: Diagnosis not present

## 2018-12-06 DIAGNOSIS — K567 Ileus, unspecified: Secondary | ICD-10-CM | POA: Diagnosis not present

## 2018-12-06 DIAGNOSIS — C61 Malignant neoplasm of prostate: Principal | ICD-10-CM | POA: Diagnosis present

## 2018-12-06 DIAGNOSIS — K9171 Accidental puncture and laceration of a digestive system organ or structure during a digestive system procedure: Secondary | ICD-10-CM | POA: Diagnosis not present

## 2018-12-06 DIAGNOSIS — C775 Secondary and unspecified malignant neoplasm of intrapelvic lymph nodes: Secondary | ICD-10-CM | POA: Diagnosis present

## 2018-12-06 DIAGNOSIS — Z87442 Personal history of urinary calculi: Secondary | ICD-10-CM

## 2018-12-06 DIAGNOSIS — K219 Gastro-esophageal reflux disease without esophagitis: Secondary | ICD-10-CM | POA: Diagnosis present

## 2018-12-06 DIAGNOSIS — Y836 Removal of other organ (partial) (total) as the cause of abnormal reaction of the patient, or of later complication, without mention of misadventure at the time of the procedure: Secondary | ICD-10-CM | POA: Diagnosis not present

## 2018-12-06 DIAGNOSIS — Y92234 Operating room of hospital as the place of occurrence of the external cause: Secondary | ICD-10-CM | POA: Diagnosis not present

## 2018-12-06 HISTORY — PX: PELVIC LYMPH NODE DISSECTION: SHX6543

## 2018-12-06 HISTORY — PX: RECTAL EXAM UNDER ANESTHESIA: SHX6399

## 2018-12-06 HISTORY — PX: ROBOT ASSISTED LAPAROSCOPIC RADICAL PROSTATECTOMY: SHX5141

## 2018-12-06 LAB — HEMOGLOBIN AND HEMATOCRIT, BLOOD
HCT: 40.7 % (ref 39.0–52.0)
Hemoglobin: 13 g/dL (ref 13.0–17.0)

## 2018-12-06 LAB — TYPE AND SCREEN
ABO/RH(D): A POS
Antibody Screen: NEGATIVE

## 2018-12-06 SURGERY — PROSTATECTOMY, RADICAL, ROBOT-ASSISTED, LAPAROSCOPIC
Anesthesia: General

## 2018-12-06 MED ORDER — CEFAZOLIN SODIUM-DEXTROSE 2-4 GM/100ML-% IV SOLN
INTRAVENOUS | Status: AC
  Filled 2018-12-06: qty 100

## 2018-12-06 MED ORDER — SUGAMMADEX SODIUM 200 MG/2ML IV SOLN
INTRAVENOUS | Status: DC | PRN
  Administered 2018-12-06: 200 mg via INTRAVENOUS

## 2018-12-06 MED ORDER — DEXAMETHASONE SODIUM PHOSPHATE 10 MG/ML IJ SOLN
INTRAMUSCULAR | Status: DC | PRN
  Administered 2018-12-06: 5 mg via INTRAVENOUS

## 2018-12-06 MED ORDER — MIDAZOLAM HCL 2 MG/2ML IJ SOLN
INTRAMUSCULAR | Status: AC
  Filled 2018-12-06: qty 2

## 2018-12-06 MED ORDER — HYDROMORPHONE HCL 1 MG/ML IJ SOLN
INTRAMUSCULAR | Status: DC | PRN
  Administered 2018-12-06 (×2): 1 mg via INTRAVENOUS

## 2018-12-06 MED ORDER — SODIUM CHLORIDE (PF) 0.9 % IJ SOLN
INTRAMUSCULAR | Status: AC
  Filled 2018-12-06: qty 20

## 2018-12-06 MED ORDER — LIDOCAINE 2% (20 MG/ML) 5 ML SYRINGE
INTRAMUSCULAR | Status: AC
  Filled 2018-12-06: qty 5

## 2018-12-06 MED ORDER — SUGAMMADEX SODIUM 200 MG/2ML IV SOLN
INTRAVENOUS | Status: AC
  Filled 2018-12-06: qty 2

## 2018-12-06 MED ORDER — SODIUM CHLORIDE (PF) 0.9 % IJ SOLN
INTRAMUSCULAR | Status: DC | PRN
  Administered 2018-12-06: 20 mL

## 2018-12-06 MED ORDER — LACTATED RINGERS IV SOLN
INTRAVENOUS | Status: DC
  Administered 2018-12-06 (×2): via INTRAVENOUS

## 2018-12-06 MED ORDER — DEXAMETHASONE SODIUM PHOSPHATE 10 MG/ML IJ SOLN
INTRAMUSCULAR | Status: AC
  Filled 2018-12-06: qty 1

## 2018-12-06 MED ORDER — LACTATED RINGERS IR SOLN
Status: DC | PRN
  Administered 2018-12-06: 1000 mL

## 2018-12-06 MED ORDER — OXYCODONE HCL 5 MG PO TABS
5.0000 mg | ORAL_TABLET | ORAL | Status: DC | PRN
  Administered 2018-12-06 – 2018-12-11 (×16): 5 mg via ORAL
  Filled 2018-12-06 (×17): qty 1

## 2018-12-06 MED ORDER — FENTANYL CITRATE (PF) 250 MCG/5ML IJ SOLN
INTRAMUSCULAR | Status: AC
  Filled 2018-12-06: qty 5

## 2018-12-06 MED ORDER — HYDROMORPHONE HCL 1 MG/ML IJ SOLN
0.5000 mg | INTRAMUSCULAR | Status: DC | PRN
  Administered 2018-12-06 – 2018-12-08 (×7): 1 mg via INTRAVENOUS
  Filled 2018-12-06 (×7): qty 1

## 2018-12-06 MED ORDER — DIPHENHYDRAMINE HCL 50 MG/ML IJ SOLN
12.5000 mg | Freq: Four times a day (QID) | INTRAMUSCULAR | Status: DC | PRN
  Filled 2018-12-06: qty 1

## 2018-12-06 MED ORDER — STERILE WATER FOR IRRIGATION IR SOLN
Status: DC | PRN
  Administered 2018-12-06: 1000 mL

## 2018-12-06 MED ORDER — HYDROMORPHONE HCL 1 MG/ML IJ SOLN
INTRAMUSCULAR | Status: AC
  Filled 2018-12-06: qty 2

## 2018-12-06 MED ORDER — SODIUM CHLORIDE 0.9 % IV BOLUS
1000.0000 mL | Freq: Once | INTRAVENOUS | Status: AC
  Administered 2018-12-06: 1000 mL via INTRAVENOUS

## 2018-12-06 MED ORDER — FENTANYL CITRATE (PF) 100 MCG/2ML IJ SOLN
INTRAMUSCULAR | Status: DC | PRN
  Administered 2018-12-06: 50 ug via INTRAVENOUS
  Administered 2018-12-06 (×2): 100 ug via INTRAVENOUS
  Administered 2018-12-06 (×3): 50 ug via INTRAVENOUS
  Administered 2018-12-06: 100 ug via INTRAVENOUS
  Administered 2018-12-06 (×2): 50 ug via INTRAVENOUS

## 2018-12-06 MED ORDER — PROPOFOL 10 MG/ML IV BOLUS
INTRAVENOUS | Status: DC | PRN
  Administered 2018-12-06: 150 mg via INTRAVENOUS

## 2018-12-06 MED ORDER — PIPERACILLIN-TAZOBACTAM 3.375 G IVPB
3.3750 g | Freq: Three times a day (TID) | INTRAVENOUS | Status: DC
  Administered 2018-12-06 – 2018-12-08 (×6): 3.375 g via INTRAVENOUS
  Filled 2018-12-06 (×6): qty 50

## 2018-12-06 MED ORDER — PROMETHAZINE HCL 25 MG/ML IJ SOLN: 6.2500 mg | INTRAMUSCULAR | Status: DC | PRN

## 2018-12-06 MED ORDER — KETOROLAC TROMETHAMINE 30 MG/ML IJ SOLN
30.0000 mg | Freq: Once | INTRAMUSCULAR | Status: AC | PRN
  Administered 2018-12-06: 30 mg via INTRAVENOUS

## 2018-12-06 MED ORDER — EPHEDRINE 5 MG/ML INJ
INTRAVENOUS | Status: AC
  Filled 2018-12-06: qty 10

## 2018-12-06 MED ORDER — MEPERIDINE HCL 50 MG/ML IJ SOLN
INTRAMUSCULAR | Status: AC
  Filled 2018-12-06: qty 1

## 2018-12-06 MED ORDER — HYDROMORPHONE HCL 2 MG/ML IJ SOLN
INTRAMUSCULAR | Status: AC
  Filled 2018-12-06: qty 1

## 2018-12-06 MED ORDER — ROCURONIUM BROMIDE 100 MG/10ML IV SOLN
INTRAVENOUS | Status: DC | PRN
  Administered 2018-12-06: 20 mg via INTRAVENOUS
  Administered 2018-12-06: 10 mg via INTRAVENOUS
  Administered 2018-12-06: 20 mg via INTRAVENOUS
  Administered 2018-12-06: 15 mg via INTRAVENOUS
  Administered 2018-12-06: 20 mg via INTRAVENOUS
  Administered 2018-12-06: 10 mg via INTRAVENOUS
  Administered 2018-12-06: 35 mg via INTRAVENOUS

## 2018-12-06 MED ORDER — DIPHENHYDRAMINE HCL 12.5 MG/5ML PO ELIX: 12.5000 mg | ORAL_SOLUTION | Freq: Four times a day (QID) | ORAL | Status: DC | PRN

## 2018-12-06 MED ORDER — OXYBUTYNIN CHLORIDE 5 MG PO TABS
5.0000 mg | ORAL_TABLET | Freq: Three times a day (TID) | ORAL | Status: DC | PRN
  Administered 2018-12-06: 5 mg via ORAL
  Filled 2018-12-06: qty 1

## 2018-12-06 MED ORDER — INDIGOTINDISULFONATE SODIUM 8 MG/ML IJ SOLN
INTRAMUSCULAR | Status: DC | PRN
  Administered 2018-12-06: 5 mL via INTRAVENOUS

## 2018-12-06 MED ORDER — PROPOFOL 10 MG/ML IV BOLUS
INTRAVENOUS | Status: AC
  Filled 2018-12-06: qty 20

## 2018-12-06 MED ORDER — MEPERIDINE HCL 50 MG/ML IJ SOLN
6.2500 mg | INTRAMUSCULAR | Status: DC | PRN
  Administered 2018-12-06: 12.5 mg via INTRAVENOUS

## 2018-12-06 MED ORDER — BUPIVACAINE LIPOSOME 1.3 % IJ SUSP
20.0000 mL | Freq: Once | INTRAMUSCULAR | Status: AC
  Administered 2018-12-06: 20 mL
  Filled 2018-12-06: qty 20

## 2018-12-06 MED ORDER — CEFAZOLIN SODIUM-DEXTROSE 2-4 GM/100ML-% IV SOLN
2.0000 g | INTRAVENOUS | Status: AC
  Administered 2018-12-06 (×2): 2 g via INTRAVENOUS
  Filled 2018-12-06: qty 100

## 2018-12-06 MED ORDER — ONDANSETRON HCL 4 MG/2ML IJ SOLN
INTRAMUSCULAR | Status: AC
  Filled 2018-12-06: qty 2

## 2018-12-06 MED ORDER — EPHEDRINE SULFATE 50 MG/ML IJ SOLN
INTRAMUSCULAR | Status: DC | PRN
  Administered 2018-12-06 (×5): 10 mg via INTRAVENOUS

## 2018-12-06 MED ORDER — HYDROMORPHONE HCL 1 MG/ML IJ SOLN
0.2500 mg | INTRAMUSCULAR | Status: DC | PRN
  Administered 2018-12-06: 0.5 mg via INTRAVENOUS

## 2018-12-06 MED ORDER — HYDROCODONE-ACETAMINOPHEN 5-325 MG PO TABS: 1.0000 | ORAL_TABLET | Freq: Four times a day (QID) | ORAL | 0 refills | Status: DC | PRN

## 2018-12-06 MED ORDER — DEXTROSE-NACL 5-0.45 % IV SOLN
INTRAVENOUS | Status: DC
  Administered 2018-12-06 – 2018-12-07 (×4): via INTRAVENOUS

## 2018-12-06 MED ORDER — ROCURONIUM BROMIDE 100 MG/10ML IV SOLN
INTRAVENOUS | Status: AC
  Filled 2018-12-06: qty 1

## 2018-12-06 MED ORDER — PANTOPRAZOLE SODIUM 40 MG PO TBEC
80.0000 mg | DELAYED_RELEASE_TABLET | Freq: Every day | ORAL | Status: DC
  Administered 2018-12-06 – 2018-12-11 (×6): 80 mg via ORAL
  Filled 2018-12-06 (×6): qty 2

## 2018-12-06 MED ORDER — ONDANSETRON HCL 4 MG/2ML IJ SOLN
INTRAMUSCULAR | Status: DC | PRN
  Administered 2018-12-06: 4 mg via INTRAVENOUS

## 2018-12-06 MED ORDER — MIDAZOLAM HCL 5 MG/5ML IJ SOLN
INTRAMUSCULAR | Status: DC | PRN
  Administered 2018-12-06: 2 mg via INTRAVENOUS

## 2018-12-06 MED ORDER — LIDOCAINE HCL (CARDIAC) PF 100 MG/5ML IV SOSY
PREFILLED_SYRINGE | INTRAVENOUS | Status: DC | PRN
  Administered 2018-12-06: 100 mg via INTRAVENOUS

## 2018-12-06 MED ORDER — ONDANSETRON HCL 4 MG/2ML IJ SOLN
4.0000 mg | INTRAMUSCULAR | Status: DC | PRN
  Administered 2018-12-07 – 2018-12-08 (×2): 4 mg via INTRAVENOUS
  Filled 2018-12-06 (×2): qty 2

## 2018-12-06 MED ORDER — BACITRACIN-NEOMYCIN-POLYMYXIN 400-5-5000 EX OINT: 1.0000 "application " | TOPICAL_OINTMENT | Freq: Three times a day (TID) | CUTANEOUS | Status: DC | PRN

## 2018-12-06 MED ORDER — KETOROLAC TROMETHAMINE 30 MG/ML IJ SOLN
INTRAMUSCULAR | Status: AC
  Filled 2018-12-06: qty 1

## 2018-12-06 MED ORDER — ACETAMINOPHEN 325 MG PO TABS
650.0000 mg | ORAL_TABLET | ORAL | Status: DC | PRN
  Administered 2018-12-08 – 2018-12-11 (×3): 650 mg via ORAL
  Filled 2018-12-06 (×3): qty 2

## 2018-12-06 MED ORDER — SULFAMETHOXAZOLE-TRIMETHOPRIM 800-160 MG PO TABS: 1.0000 | ORAL_TABLET | Freq: Two times a day (BID) | ORAL | 0 refills | Status: DC

## 2018-12-06 MED ORDER — INDIGOTINDISULFONATE SODIUM 8 MG/ML IJ SOLN
INTRAMUSCULAR | Status: AC
  Filled 2018-12-06: qty 5

## 2018-12-06 SURGICAL SUPPLY — 70 items
ADH SKN CLS APL DERMABOND .7 (GAUZE/BANDAGES/DRESSINGS)
AGENT HMST KT MTR STRL THRMB (HEMOSTASIS)
APL ESCP 34 STRL LF DISP (HEMOSTASIS)
APL SWBSTK 6 STRL LF DISP (MISCELLANEOUS) ×2
APPLICATOR COTTON TIP 6 STRL (MISCELLANEOUS) ×2 IMPLANT
APPLICATOR COTTON TIP 6IN STRL (MISCELLANEOUS) ×4
APPLICATOR SURGIFLO ENDO (HEMOSTASIS) IMPLANT
CATH FOLEY 2WAY SLVR  5CC 18FR (CATHETERS) ×2
CATH FOLEY 2WAY SLVR 5CC 18FR (CATHETERS) ×2 IMPLANT
CATH TIEMANN FOLEY 18FR 5CC (CATHETERS) ×4 IMPLANT
CHLORAPREP W/TINT 26ML (MISCELLANEOUS) ×4 IMPLANT
CLIP VESOLOCK LG 6/CT PURPLE (CLIP) ×8 IMPLANT
COVER SURGICAL LIGHT HANDLE (MISCELLANEOUS) ×4 IMPLANT
COVER TIP SHEARS 8 DVNC (MISCELLANEOUS) ×2 IMPLANT
COVER TIP SHEARS 8MM DA VINCI (MISCELLANEOUS) ×2
COVER WAND RF STERILE (DRAPES) IMPLANT
CUTTER ECHEON FLEX ENDO 45 340 (ENDOMECHANICALS) ×4 IMPLANT
DECANTER SPIKE VIAL GLASS SM (MISCELLANEOUS) ×4 IMPLANT
DERMABOND ADVANCED (GAUZE/BANDAGES/DRESSINGS)
DERMABOND ADVANCED .7 DNX12 (GAUZE/BANDAGES/DRESSINGS) IMPLANT
DRAPE ARM DVNC X/XI (DISPOSABLE) ×8 IMPLANT
DRAPE COLUMN DVNC XI (DISPOSABLE) ×2 IMPLANT
DRAPE DA VINCI XI ARM (DISPOSABLE) ×8
DRAPE DA VINCI XI COLUMN (DISPOSABLE) ×2
DRAPE SHEET LG 3/4 BI-LAMINATE (DRAPES) ×3 IMPLANT
DRAPE SURG IRRIG POUCH 19X23 (DRAPES) ×4 IMPLANT
DRSG TEGADERM 4X4.75 (GAUZE/BANDAGES/DRESSINGS) ×4 IMPLANT
ELECT REM PT RETURN 15FT ADLT (MISCELLANEOUS) ×4 IMPLANT
GLOVE BIO SURGEON STRL SZ 6 (GLOVE) ×2 IMPLANT
GLOVE BIO SURGEON STRL SZ 6.5 (GLOVE) ×3 IMPLANT
GLOVE BIO SURGEON STRL SZ7.5 (GLOVE) ×8 IMPLANT
GLOVE BIO SURGEONS STRL SZ 6.5 (GLOVE) ×1
GLOVE INDICATOR 6.5 STRL GRN (GLOVE) ×2 IMPLANT
GOWN STRL REUS W/TWL 2XL LVL3 (GOWN DISPOSABLE) ×3 IMPLANT
GOWN STRL REUS W/TWL LRG LVL3 (GOWN DISPOSABLE) ×4 IMPLANT
GOWN STRL REUS W/TWL XL LVL3 (GOWN DISPOSABLE) ×8 IMPLANT
HEMOSTAT SURGICEL 4X8 (HEMOSTASIS) IMPLANT
HOLDER FOLEY CATH W/STRAP (MISCELLANEOUS) ×4 IMPLANT
IRRIG SUCT STRYKERFLOW 2 WTIP (MISCELLANEOUS) ×4
IRRIGATION SUCT STRKRFLW 2 WTP (MISCELLANEOUS) ×2 IMPLANT
IV LACTATED RINGERS 1000ML (IV SOLUTION) ×4 IMPLANT
NDL INSUFFLATION 14GA 120MM (NEEDLE) ×2 IMPLANT
NEEDLE INSUFFLATION 14GA 120MM (NEEDLE) ×4 IMPLANT
PACK ROBOT UROLOGY CUSTOM (CUSTOM PROCEDURE TRAY) ×4 IMPLANT
PAD POSITIONING PINK XL (MISCELLANEOUS) ×4 IMPLANT
PORT ACCESS TROCAR AIRSEAL 12 (TROCAR) ×2 IMPLANT
PORT ACCESS TROCAR AIRSEAL 5M (TROCAR) ×2
RELOAD STAPLE 45 4.1 GRN THCK (STAPLE) ×2 IMPLANT
SEAL CANN UNIV 5-8 DVNC XI (MISCELLANEOUS) ×8 IMPLANT
SEAL XI 5MM-8MM UNIVERSAL (MISCELLANEOUS) ×8
SEALER VESSEL DA VINCI XI (MISCELLANEOUS) ×2
SEALER VESSEL EXT DVNC XI (MISCELLANEOUS) IMPLANT
SET TRI-LUMEN FLTR TB AIRSEAL (TUBING) ×4 IMPLANT
SOLUTION ELECTROLUBE (MISCELLANEOUS) ×4 IMPLANT
SPONGE LAP 4X18 RFD (DISPOSABLE) ×4 IMPLANT
STAPLE RELOAD 45 GRN (STAPLE) ×2 IMPLANT
STAPLE RELOAD 45MM GREEN (STAPLE) ×4
SURGIFLO W/THROMBIN 8M KIT (HEMOSTASIS) IMPLANT
SUT ETHILON 3 0 PS 1 (SUTURE) ×6 IMPLANT
SUT MNCRL AB 4-0 PS2 18 (SUTURE) ×8 IMPLANT
SUT VIC AB 0 UR5 27 (SUTURE) ×4 IMPLANT
SUT VIC AB 2-0 SH 27 (SUTURE) ×8
SUT VIC AB 2-0 SH 27X BRD (SUTURE) ×2 IMPLANT
SUT VIC AB 3-0 SH 27 (SUTURE) ×8
SUT VIC AB 3-0 SH 27XBRD (SUTURE) IMPLANT
SUT VICRYL 0 UR6 27IN ABS (SUTURE) ×8 IMPLANT
SUT VLOC BARB 180 ABS3/0GR12 (SUTURE) ×16
SUTURE VLOC BRB 180 ABS3/0GR12 (SUTURE) ×4 IMPLANT
TOWEL OR NON WOVEN STRL DISP B (DISPOSABLE) ×4 IMPLANT
WATER STERILE IRR 1000ML POUR (IV SOLUTION) ×4 IMPLANT

## 2018-12-06 NOTE — Anesthesia Procedure Notes (Signed)
Procedure Name: Intubation Date/Time: 12/06/2018 7:41 AM Performed by: Jonna Munro, CRNA Pre-anesthesia Checklist: Patient identified, Emergency Drugs available, Suction available, Patient being monitored and Timeout performed Patient Re-evaluated:Patient Re-evaluated prior to induction Oxygen Delivery Method: Circle system utilized Preoxygenation: Pre-oxygenation with 100% oxygen Induction Type: IV induction Ventilation: Mask ventilation without difficulty Laryngoscope Size: Mac and 3 Grade View: Grade I Tube type: Oral Tube size: 7.5 mm Number of attempts: 1 Airway Equipment and Method: Stylet Placement Confirmation: ETT inserted through vocal cords under direct vision,  positive ETCO2 and breath sounds checked- equal and bilateral Secured at: 23 cm Tube secured with: Tape Dental Injury: Teeth and Oropharynx as per pre-operative assessment

## 2018-12-06 NOTE — Interval H&P Note (Signed)
History and Physical Interval Note:  12/06/2018 7:27 AM  Allen Tran  has presented today for surgery, with the diagnosis of PROSTATE CANCER  The various methods of treatment have been discussed with the patient and family. After consideration of risks, benefits and other options for treatment, the patient has consented to  Procedure(s): XI ROBOTIC ASSISTED LAPAROSCOPIC RADICAL PROSTATECTOMY (N/A) PELVIC LYMPH NODE DISSECTION (Bilateral) as a surgical intervention .  The patient's history has been reviewed, patient examined, no change in status, stable for surgery.  I have reviewed the patient's chart and labs.  Questions were answered to the patient's satisfaction.     Marton Redwood, III

## 2018-12-06 NOTE — Op Note (Addendum)
Operative Note  Preoperative diagnosis:  1.  Prostate cancer   Postoperative diagnosis: 1.  Prostate cancer  Procedure(s): 1.  Robotic assisted laparoscopic prostatectomy with bilateral pelvic lymph node dissection 2.  Primary repair of rectal injury performed by Dr. Barry Dienes  Surgeon: Link Snuffer, MD  Assistants: Debbrah Alar, PA--an assistant was needed due to the nature of the case being a robotic surgery requiring a bedside assistant for retraction, suctioning, exchanging instruments, etc.   Anesthesia: General   Complications: Rectal injury  EBL: 100 cc   Specimens: 1. prostate and seminal vesicles 2.  Periprosthetic fat 3.  Right pelvic lymph node packet 4.  Left pelvic lymph node packet  Drains/Catheters: 1. 35 French Foley catheter and JP drain   Intraoperative findings: prostate and seminal vesicles were removed en bloc Without any evidence of gross extraprostatic disease.  There was a nonthermal sharp 1 cm laceration to the rectum.  This was recognized and repaired by general surgery/Dr. Barry Dienes  Indication: 68 year old male with very high risk prostate cancer, Gleason 9, PSA of 38 elected to undergo the above operation.  Description of procedure:  The patient was identified and consent was obtained.  The patient was taken to the operating room and placed in the supine position.  The patient was placed under general anesthesia.  Perioperative antibiotics were administered.  The patient was placed in dorsal lithotomy.  Patient was prepped and draped in a standard sterile fashion and a timeout was performed.  The patient was placed in steep Trendelenburg.  Foley catheter was placed.  Veress needle was inserted supraumbilical and the drop test was performed with no evidence of any injury.  The abdomen was then insufflated to a pressure of 15.  4 robotic working ports, a 12 mm assistant port, and a 5 mm assistant port were placed under direct visualization in a standard  fashion for a robotic pelvic surgery.  The abdomen was inspected and there was no evidence of any visceral or vascular injury.  I first released colonic adhesions in the left lower quadrant.  I then retracted the sigmoid and rectum superiorly.  I first started with the posterior dissection by incising along the posterior peritoneum and identifying bilateral vas deferens.  These were divided and bilateral seminal vesicles were carefully dissected out.  Due to the extent of disease, operative planes were distorted but I was able to dissect out bilateral vas deferens.  Tissue was quite adherent.  While dissecting posteriorly, a sharp 1 cm injury to the rectum was identified.  General surgery was consulted and decision was made to proceed with prostatectomy.  There was no gross spillage.   The bladder was then dropped by incising along the medial umbilical ligament bilaterally.  Periprosthetic fat was cleared off the prostate and passed off and passed off for specimen.  The endopelvic fascia was incised bilaterally and the puboprostatic ligaments were released.  A stapler with a vascular staple load was used to staple the dorsal venous complex.  I then incised along into the bladder neck and divided the bladder neck.  The catheter was brought out and used for retraction.  I divided the remainder of the bladder neck and then pulled the seminal vesicles out of this incision.  Careful dissection was performed and bilateral prostatic pedicles were released using Hem-o-lok clips and sharp dissection.  Spot cautery was sparingly used.  Periprosthetic tissue was carefully released inferiorly and laterally, performing a non-nerve sparing operation bilaterally.  Once only the urethra remained attached,  the anterior portion of the urethra was divided.  A 2-0 Vicryl stitch was placed at the 6 o'clock position.  The remainder of the urethra was divided and the prostate was placed in a specimen bag.    I then performed the  bilateral pelvic lymph node dissection.  I first carefully removed lymph node packet from the right side overlying the external iliac artery and vein down to the level of the obturator nerve.  This was passed off for specimen and then I carefully removed the left sided pelvic lymph node packets again overlying the external iliac artery and vein down to the level of the obturator nerve taking care not to injure any vital structures.  At this point, Dr. Barry Dienes performed primary repair of rectal injury.  Please see separate dictated note.  The 2-0 Vicryl stitch was used to reapproximate the bladder and urethra at the 6 o'clock position.  A running 3-0 V lock suture was then used to reapproximate the bladder and urethra.  The bladder was filled with normal saline and there was no evidence of any leak at the anastomosis.  The anastomosis was watertight.  The Foley catheter was exchanged for a fresh catheter.  A JP drain was inserted from the left lateral port site.  This was secured down with a nylon stitch.  The robot was undocked and the patient was taken out of Trendelenburg.  All working ports were removed under direct visualization with the camera to ensure there was no bleeding from the sites.  The midline port incision was extended and the prostate extracted in the specimen bag.  Interrupted figure-of-eight 0 Vicryl sutures were used to close  the fascia.  The 12 mm assistant port fascia was also closed with a 2-0 Vicryl.  Skin was then closed with 4-0 Monocryl and Dermabond.  Exparel was used for anesthetic effect.  This concluded the operation.  The patient tolerated procedure well and was stable postoperatively.  Plan: Will obtain stat labs.  Keep n.p.o. and advance very slowly.  Nothing per rectum.  He will keep his catheter for 14 days.

## 2018-12-06 NOTE — Anesthesia Postprocedure Evaluation (Signed)
Anesthesia Post Note  Patient: Allen Tran  Procedure(s) Performed: XI ROBOTIC ASSISTED LAPAROSCOPIC RADICAL PROSTATECTOMY (N/A ) PELVIC LYMPH NODE DISSECTION (Bilateral ) ROBOTIC PRIMARY REPAIR OF RECTAL INJURY WITH PROCTOSCOPY. (N/A )     Patient location during evaluation: PACU Anesthesia Type: General Level of consciousness: sedated Pain management: pain level controlled Vital Signs Assessment: post-procedure vital signs reviewed and stable Respiratory status: spontaneous breathing Cardiovascular status: stable Postop Assessment: no apparent nausea or vomiting Anesthetic complications: no    Last Vitals:  Vitals:   12/06/18 1430 12/06/18 1448  BP: (!) 151/89 (!) 158/89  Pulse: 93 92  Resp: 11 15  Temp: 36.8 C 36.6 C  SpO2: 100% 100%    Last Pain:  Vitals:   12/06/18 1448  TempSrc: Oral  PainSc:    Pain Goal:                   Huston Foley

## 2018-12-06 NOTE — Transfer of Care (Signed)
Immediate Anesthesia Transfer of Care Note  Patient: Allen Tran  Procedure(s) Performed: XI ROBOTIC ASSISTED LAPAROSCOPIC RADICAL PROSTATECTOMY (N/A ) PELVIC LYMPH NODE DISSECTION (Bilateral ) ROBOTIC PRIMARY REPAIR OF RECTAL INJURY WITH PROCTOSCOPY. (N/A )  Patient Location: PACU  Anesthesia Type:General  Level of Consciousness: awake, alert  and oriented  Airway & Oxygen Therapy: Patient Spontanous Breathing and Patient connected to face mask oxygen  Post-op Assessment: Report given to RN and Post -op Vital signs reviewed and stable  Post vital signs: Reviewed stable  Last Vitals:  Vitals Value Taken Time  BP    Temp    Pulse    Resp    SpO2      Last Pain:  Vitals:   12/06/18 0622  TempSrc:   PainSc: 0-No pain         Complications: No apparent anesthesia complications

## 2018-12-06 NOTE — H&P (Signed)
CC/HPI: Pt presents today for pre-operative history and physical exam in anticipation of robotic assisted lap prostatectomy with pelvic lymph node dissection on 12/06/18 by Dr. Gloriann Loan. He speaks some English but his wife acts as his Optometrist. He is doing well and is without complaint.   Pt denies F/C, HA, CP, SOB, N/V, diarrhea/constipation, back pain, flank pain, hematuria, and dysuria.   Primary language is [place].    HX:  Cc: Lower urinary tract symptoms  HPI:  08/27/2018  Patient presents today mainly with complaints of obstructive lower urinary tract symptoms including intermittency, hesitancy, weak stream, nocturia, urgency and frequency. He has never tried anything for his prostate. Unsure of if he has had a PSA performed before. He has been seen in the past for history of kidney stones. He has not had a kidney stone in the recent interval. Urinalysis is negative today.   09/10/2018:  Patient presents today for prostate biopsy for a PSA of 38.   10/12/2018:  Patient underwent a bone scan and CT scan. These were negative for evidence of metastatic disease. He continues on Flomax which has helped a small amount. He and his wife have discussed and read about prostate cancer management and would like to proceed with surgery.   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 7 out of 12 cores were positive for cancer. All 6 biopsies on the left were positive.     ALLERGIES: No Allergies    MEDICATIONS: Tamsulosin Hcl 0.4 mg capsule 1 capsule PO Daily  NexIUM PACK Oral     GU PSH: Cysto Uretero Lithotripsy - 2012 Cystoscopy Insert Stent - 2012, 2012 Locm 300-399Mg /Ml Iodine,1Ml - 10/01/2018 Prostate Needle Biopsy - 09/10/2018      PSH Notes:  Cystoscopy With Ureteroscopy With Lithotripsy, Cystoscopy With Insertion Of Ureteral Stent Right, Cystoscopy With Insertion Of Ureteral Stent Right, No Surgical Problems   NON-GU PSH: Surgical Pathology, Gross And Microscopic Examination For Prostate Needle - 09/10/2018    GU PMH: Prostate Cancer - 10/12/2018 Elevated PSA - 09/10/2018 BPH w/LUTS - 08/27/2018 Encounter for Prostate Cancer screening - 08/27/2018 Incomplete bladder emptying - 08/27/2018 Nocturia - 08/27/2018 Straining on Urination - 08/27/2018 Urinary Hesitancy - 08/27/2018 Weak Urinary Stream - 08/27/2018 Urinary Frequency, Increased urinary frequency - 2014 Renal calculus      PMH Notes:  2011-10-07 16:41:46 - Note: Urinary Calculus On The Right   NON-GU PMH: Muscle weakness (generalized) - 10/22/2018 Personal history of other specified conditions, History of heartburn - 2014 Encounter for general adult medical examination without abnormal findings, Encounter for preventive health examination GERD    FAMILY HISTORY: 2 sons - Other No Significant Family History - Runs In Family   SOCIAL HISTORY: Marital Status: Married Race: White Current Smoking Status: Patient has never smoked.   Tobacco Use Assessment Completed: Used Tobacco in last 30 days? Does not use smokeless tobacco. Has never drank.  Does not use drugs. Drinks 3 caffeinated drinks per day. Has not had a blood transfusion. 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     Notes: Occupation:, Caffeine Use, Alcohol Use, Tobacco Use, Marital History - Currently Married, Never A Smoker   REVIEW OF SYSTEMS:    GU Review Male:   Patient reports get up at night to  urinate. Patient denies frequent urination, hard to postpone urination, burning/ pain with urination, leakage of urine, stream starts and stops, trouble starting your stream, have to strain to urinate , erection problems, and penile pain.  Gastrointestinal (Upper):   Patient  reports indigestion/ heartburn. Patient denies nausea and vomiting.  Gastrointestinal (Lower):   Patient denies diarrhea and constipation.  Constitutional:   Patient denies fever, night sweats, weight loss, and fatigue.  Skin:   Patient denies skin rash/ lesion and itching.  Eyes:   Patient denies blurred vision and double vision.  Ears/ Nose/ Throat:   Patient denies sore throat and sinus problems.  Hematologic/Lymphatic:   Patient denies swollen glands and easy bruising.  Cardiovascular:   Patient denies leg swelling and chest pains.  Respiratory:   Patient denies cough and shortness of breath.  Endocrine:   Patient denies excessive thirst.  Musculoskeletal:   Patient denies back pain and joint pain.  Neurological:   Patient denies headaches and dizziness.  Psychologic:   Patient denies depression and anxiety.   VITAL SIGNS:      11/23/2018 01:39 PM  BP 128/75 mmHg  Pulse 67 /min  Temperature 98.2 F / 36.7 C   MULTI-SYSTEM PHYSICAL EXAMINATION:    Constitutional: Well-nourished. No physical deformities. Normally developed. Good grooming.  Neck: Neck symmetrical, not swollen. Normal tracheal position.  Respiratory: Normal breath sounds. No labored breathing, no use of accessory muscles.   Cardiovascular: Regular rate and rhythm. No murmur, no gallop. Normal temperature.   Lymphatic: No enlargement of neck, axillae, groin.  Skin: No paleness, no jaundice, no cyanosis. No lesion, no ulcer, no rash.  Neurologic / Psychiatric: Oriented to time, oriented to place, oriented to person. No depression, no anxiety, no agitation.  Gastrointestinal: No mass, no tenderness, no rigidity, non obese abdomen.  Eyes: Normal conjunctivae. Normal eyelids.  Ears, Nose, Mouth, and Throat: Left ear no scars, no lesions, no masses. Right ear no scars, no lesions, no masses. Nose no scars, no lesions, no masses. Normal hearing. Normal lips.  Musculoskeletal: Normal gait and station of head and neck.      PAST DATA REVIEWED:  Source Of History:  Patient  Records Review:   Previous Patient Records  Urine Test Review:   Urinalysis   08/27/18  PSA  Total PSA 38.90 ng/mL    11/23/18  Urinalysis  Urine Appearance Clear   Urine Color Yellow   Urine Glucose Neg mg/dL  Urine Bilirubin Neg mg/dL  Urine Ketones Neg mg/dL  Urine Specific Gravity 1.020   Urine Blood Neg ery/uL  Urine pH 6.5   Urine Protein Trace mg/dL  Urine Urobilinogen 0.2 mg/dL  Urine Nitrites Neg   Urine Leukocyte Esterase Neg leu/uL   PROCEDURES:          Urinalysis - 81003 Dipstick Dipstick Cont'd  Color: Yellow Bilirubin: Neg mg/dL  Appearance: Clear Ketones: Neg mg/dL  Specific Gravity: 1.020 Blood: Neg ery/uL  pH: 6.5 Protein: Trace mg/dL  Glucose: Neg mg/dL Urobilinogen: 0.2 mg/dL    Nitrites: Neg    Leukocyte Esterase: Neg leu/uL    ASSESSMENT:      ICD-10 Details  1 GU:   Prostate Cancer - C61    PLAN:           Schedule Return Visit/Planned Activity: Keep Scheduled Appointment - Schedule Surgery          Document Letter(s):  Created for Patient: Clinical Summary         Notes:  There are no changes in the patients history or physical exam since last evaluation by Dr. Gloriann Loan. Pt is scheduled to undergo RALP with BPLND on 12/06/18.    All pt's questions were answered to the best of my ability.          Next Appointment:      Next Appointment: 12/06/2018 07:15 AM    Appointment Type: Surgery     Location: [facility] [phone]    Provider: Link Snuffer, III, M.D.    Reason for Visit: OBS WL RALP BIL PLND     Signed by Mcarthur Rossetti, PA on 11/23/18 at 3:24 PM (EST

## 2018-12-06 NOTE — Discharge Instructions (Signed)

## 2018-12-06 NOTE — Op Note (Signed)
PRE-OPERATIVE DIAGNOSIS: Prostate cancer  POST-OPERATIVE DIAGNOSIS:  Same and rectal injury  PROCEDURE:  Procedure(s): Primary repair of low rectal injury and rigid proctoscopy  SURGEON:  Surgeon(s): Stark Klein, MD  Assistant: Debbrah Alar, PA-C  ANESTHESIA:   general  DRAINS: (38 Fr) Blake drain(s) in the pelvis   LOCAL MEDICATIONS USED:  NONE  SPECIMEN:  No Specimen  DISPOSITION OF SPECIMEN:  N/A  COUNTS:  YES  DICTATION: .Dragon Dictation  PLAN OF CARE: Admit to inpatient   PATIENT DISPOSITION:  PACU - hemodynamically stable.  FINDINGS: 2 cm low rectal injury on the right anterior aspect  EBL: min  PROCEDURE:   Was called to the room to evaluate a rectal injury.  This occurred during a routine robotic prostatectomy.  The patient had had a bowel prep and had no evidence of spillage.  The injury was from sharp dissection and not from cautery.  It was determined that this could be repaired primarily.  The robotic prostatectomy continued until the prostate was fully resected.  Prior to reconstruction of the urethra, the robotic repair was undertaken.  I utilized the robot for the repair.  Rectal exam was performed by the robotic surgeon to assist with identification of the small defect.  Full-thickness primary repair was performed with a V-Lok suture in running Spooner fashion.  This was a longitudinal injury.  There was sufficient perirectal fat to lay over the top of the repair.  3-0 Vicryl interrupted sutures were used to secure the fatty tissue over the defect.  The rigid proctoscopy was placed into the anus and irrigant was placed into the pelvis to cover the repair.  The rectum was insufflated and there was no evidence of leak.  Patient was given back to urology to complete the anastomosis of the ureter.  The prostate was removed from the abdomen.  A 19 [place] Blake drain was placed into the pelvis.  The robot was undocked.  The pelvis was again irrigated and  formal proctoscopy was performed.  There was no evidence of bleeding at the site of the repair.  The rectum insufflated easily and there was no evidence of air leak.  The urology team closed the abdomen.  The patient was taken to the PACU in stable condition following emergence from anesthesia.  Counts were correct.

## 2018-12-07 ENCOUNTER — Encounter (HOSPITAL_COMMUNITY): Payer: Self-pay | Admitting: Urology

## 2018-12-07 LAB — BASIC METABOLIC PANEL
Anion gap: 7 (ref 5–15)
BUN: 17 mg/dL (ref 8–23)
CHLORIDE: 103 mmol/L (ref 98–111)
CO2: 28 mmol/L (ref 22–32)
Calcium: 8.4 mg/dL — ABNORMAL LOW (ref 8.9–10.3)
Creatinine, Ser: 1.07 mg/dL (ref 0.61–1.24)
GFR calc Af Amer: >60 mL/min (ref >60)
GFR calc non Af Amer: >60 mL/min (ref >60)
Glucose, Bld: 121 mg/dL — ABNORMAL HIGH (ref 70–99)
POTASSIUM: 3.9 mmol/L (ref 3.5–5.1)
Sodium: 138 mmol/L (ref 135–145)

## 2018-12-07 LAB — HEMOGLOBIN AND HEMATOCRIT, BLOOD
HEMATOCRIT: 36.4 % — AB (ref 39.0–52.0)
Hemoglobin: 11.6 g/dL — ABNORMAL LOW (ref 13.0–17.0)

## 2018-12-07 MED ORDER — OXYBUTYNIN CHLORIDE 5 MG PO TABS
5.0000 mg | ORAL_TABLET | Freq: Three times a day (TID) | ORAL | Status: DC
  Administered 2018-12-07 – 2018-12-10 (×9): 5 mg via ORAL
  Filled 2018-12-07 (×9): qty 1

## 2018-12-07 MED ORDER — SIMETHICONE 80 MG PO CHEW
80.0000 mg | CHEWABLE_TABLET | Freq: Four times a day (QID) | ORAL | Status: DC | PRN
  Administered 2018-12-07 – 2018-12-08 (×3): 80 mg via ORAL
  Filled 2018-12-07 (×3): qty 1

## 2018-12-07 MED ORDER — DIAZEPAM 5 MG PO TABS
5.0000 mg | ORAL_TABLET | Freq: Four times a day (QID) | ORAL | Status: DC | PRN
  Administered 2018-12-07 (×2): 5 mg via ORAL
  Filled 2018-12-07 (×2): qty 1

## 2018-12-07 MED ORDER — DOCUSATE SODIUM 100 MG PO CAPS
100.0000 mg | ORAL_CAPSULE | Freq: Every day | ORAL | Status: DC
  Administered 2018-12-07 – 2018-12-11 (×5): 100 mg via ORAL
  Filled 2018-12-07 (×5): qty 1

## 2018-12-07 MED ORDER — ENOXAPARIN SODIUM 40 MG/0.4ML ~~LOC~~ SOLN
40.0000 mg | SUBCUTANEOUS | Status: DC
  Administered 2018-12-07 – 2018-12-11 (×5): 40 mg via SUBCUTANEOUS
  Filled 2018-12-07 (×5): qty 0.4

## 2018-12-07 MED ORDER — METHOCARBAMOL 500 MG PO TABS
500.0000 mg | ORAL_TABLET | Freq: Four times a day (QID) | ORAL | Status: DC | PRN
  Administered 2018-12-07: 500 mg via ORAL
  Filled 2018-12-07: qty 1

## 2018-12-07 NOTE — Progress Notes (Addendum)
Seen, agree with below.  Pt with bladder spasms and a sense of feeling bloated.    Only mild distention on exam.    Sips/ice chips ok for around 24 ounces for 24 hours.  Await return of bowel function.   Ok to ambulate from general surgery standpoint.   Nothing per rectum.   Anticipate clears in AM tomorrow.    Stark Klein, MD, FACS Surgical Oncology and General Surgery, Surgical Critical Care.     [facility] Progress Note  1 Day Post-Op  Subjective: CC-  Several family members at bedside this morning. States that he had a rough night last night. Having significant abdominal pain. States that he feels bloated. No flatus. Denies any nausea or vomiting. Pain medications help a little but wear off too quickly. States that he was told to remain on bed rest last night. No stool, blood, or drainage from rectum.  Objective: Vital signs in last 24 hours: Temp:  [97.3 F (36.3 C)-98.5 F (36.9 C)] 98.5 F (36.9 C) (01/28 0630) Pulse Rate:  [77-97] 77 (01/28 0630) Resp:  [9-20] 20 (01/28 0630) BP: (133-169)/(78-96) 133/87 (01/28 0630) SpO2:  [96 %-100 %] 100 % (01/28 0630) Last BM Date: 12/05/18  Intake/Output from previous day: 01/27 0701 - 01/28 0700 In: 2861.5 [P.O.:120; I.V.:2645; IV Piggyback:96.5] Out: 2300 [Urine:1600; Drains:230; Blood:470] Intake/Output this shift: No intake/output data recorded.  PE: Gen:  Alert, NAD Pulm:  effort normal Abd: Soft, mild distension, few BS heard, lap incisions cdi, JP drain with bloody/serosanguinous output (230cc output recorded), no drainage from rectum noted on pad Skin: warm and dry  Lab Results:  Recent Labs    12/06/18 1321 12/07/18 0537  HGB 13.0 11.6*  HCT 40.7 36.4*   BMET Recent Labs    12/07/18 0537  NA 138  K 3.9  CL 103  CO2 28  GLUCOSE 121*  BUN 17  CREATININE 1.07  CALCIUM 8.4*   PT/INR No results for input(s): LABPROT, INR in the last 72 hours. CMP     Component Value Date/Time   NA 138 12/07/2018 0537   K 3.9 12/07/2018 0537   CL 103 12/07/2018 0537   CO2 28 12/07/2018 0537   GLUCOSE 121 (H) 12/07/2018 0537   BUN 17 12/07/2018 0537   CREATININE 1.07 12/07/2018 0537   CALCIUM 8.4 (L) 12/07/2018 0537   PROT 6.7 02/14/2012 2215   ALBUMIN 4.0 02/14/2012 2215   AST 14 02/14/2012 2215   ALT 13 02/14/2012 2215   ALKPHOS 67 02/14/2012 2215   BILITOT 0.3 02/14/2012 2215   GFRNONAA >60 12/07/2018 0537   GFRAA >60 12/07/2018 0537   Lipase  No results found for: LIPASE     Studies/Results: No results found.  Anti-infectives: Anti-infectives (From admission, onward)   Start     Dose/Rate Route Frequency Ordered Stop   12/06/18 1600  piperacillin-tazobactam (ZOSYN) IVPB 3.375 g     3.375 g 12.5 mL/hr over 240 Minutes Intravenous Every 8 hours 12/06/18 1458     12/06/18 0546  ceFAZolin (ANCEF) IVPB 2g/100 mL premix     2 g 200 mL/hr over 30 Minutes Intravenous 30 min pre-op 12/06/18 0546 12/06/18 1206   12/06/18 0000  sulfamethoxazole-trimethoprim (BACTRIM DS,SEPTRA DS) 800-160 MG tablet     1 tablet Oral 2 times daily 12/06/18 0732         Assessment/Plan  1. Prostate cancer S/p Robotic assisted laparoscopic prostatectomy with bilateral pelvic lymph node dissection Dr. Gloriann Loan 1/27  2. Intraoperative  rectal injury S/p Primary repair of low rectal injury and rigid proctoscopy 1/27 Dr. Barry Dienes - POD 1 - Patient with postoperative ileus. When ok with Dr. Gloriann Loan, recommend getting OOB/ambulating. Would keep NPO until return in bowel function. Nothing per rectum.  ID - zosyn 1/27>> FEN - IVF, NPO VTE - SCDs, lovenox Foley - in place   LOS: 0 days    Wellington Hampshire , [facility] Surgery 12/07/2018, 7:59 AM Pager: [phone] Mon-Thurs 7:00 am-4:30 pm Fri 7:00 am -11:30 AM Sat-Sun 7:00 am-11:30 am

## 2018-12-07 NOTE — Progress Notes (Signed)
Urology Progress Note   1 Day Post-Op  Subjective/Interval: NAEON. AFVSS. UOP adequate. Drain output s/s and low overnight. Reports bloating/gas pain this am. Not yet ambulating. No flatus/BM. General surgery following  Objective: Vital signs in last 24 hours: Temp:  [97.3 F (36.3 C)-98.5 F (36.9 C)] 98.5 F (36.9 C) (01/28 0630) Pulse Rate:  [77-97] 77 (01/28 0630) Resp:  [9-20] 20 (01/28 0630) BP: (133-169)/(78-96) 133/87 (01/28 0630) SpO2:  [96 %-100 %] 100 % (01/28 0630)  Intake/Output from previous day: 01/27 0701 - 01/28 0700 In: 2861.5 [P.O.:120; I.V.:2645; IV Piggyback:96.5] Out: 2300 [Urine:1600; Drains:230; Blood:470] Intake/Output this shift: Total I/O In: -  Out: 40 [Drains:40]  Physical Exam:  General: Alert and oriented CV: RRR Lungs: Clear Abdomen: Soft, appropriately tender, incisions c/d/i, JP with thin s/s output that is non-foul GU: Foley in place draining clear yellow urine Ext: NT, No erythema  Lab Results: Recent Labs    12/06/18 1321 12/07/18 0537  HGB 13.0 11.6*  HCT 40.7 36.4*   BMET Recent Labs    12/07/18 0537  NA 138  K 3.9  CL 103  CO2 28  GLUCOSE 121*  BUN 17  CREATININE 1.07  CALCIUM 8.4*     Studies/Results: No results found.  Assessment/Plan:  68 y.o. male s/p RALP 1/27 with rectal injury repaired primarily by general surgery.  - Continue PO/IV pain control, [place] ditropan for bladder spasms - NPO sips/chips until ROBF, general surgery following - Nothing per rectum - Continue Foley catheter s/p RALP, continue JP - Continue mIVFs with d5 1/2 NS at 100 mL/hr - Zosyn - AM CBC - OOB, SCDs, IS, Lovenox ppx    LOS: 0 days   Case Rob Bunting 12/07/2018, 8:37 AM

## 2018-12-08 LAB — CBC
HCT: 33.6 % — ABNORMAL LOW (ref 39.0–52.0)
Hemoglobin: 10.9 g/dL — ABNORMAL LOW (ref 13.0–17.0)
MCH: 31 pg (ref 26.0–34.0)
MCHC: 32.4 g/dL (ref 30.0–36.0)
MCV: 95.5 fL (ref 80.0–100.0)
Platelets: 189 10*3/uL (ref 150–400)
RBC: 3.52 MIL/uL — ABNORMAL LOW (ref 4.22–5.81)
RDW: 12.3 % (ref 11.5–15.5)
WBC: 12.9 10*3/uL — ABNORMAL HIGH (ref 4.0–10.5)
nRBC: 0 % (ref 0.0–0.2)

## 2018-12-08 NOTE — Progress Notes (Addendum)
  [facility] Progress Note  2 Days Post-Op  Subjective: CC-  Abdominal pain/bloating a little less today, but still no flatus or BM. Denies any nausea or vomiting. Ambulated into the hall 3x yesterday.  WBC slightly up 12.9, afebrile.  Objective: Vital signs in last 24 hours: Temp:  [98.1 F (36.7 C)-99.1 F (37.3 C)] 98.1 F (36.7 C) (01/29 0515) Pulse Rate:  [79-90] 90 (01/29 0515) Resp:  [18] 18 (01/29 0515) BP: (109-145)/(70-93) 145/93 (01/29 0515) SpO2:  [93 %-100 %] 95 % (01/29 0515) Last BM Date: 12/06/18  Intake/Output from previous day: 01/28 0701 - 01/29 0700 In: 2636.5 [P.O.:240; I.V.:2258.3; IV Piggyback:138.2] Out: 7858 [IFOYD:7412; Drains:170] Intake/Output this shift: No intake/output data recorded.  PE: Gen:  Alert, NAD Pulm:  effort normal Abd: Soft, mild distension, few BS heard, lap incisions cdi, JP drain with bloody/serosanguinous output (170cc output recorded) Skin: warm and dry  Lab Results:  Recent Labs    12/07/18 0537 12/08/18 0616  WBC  --  12.9*  HGB 11.6* 10.9*  HCT 36.4* 33.6*  PLT  --  189   BMET Recent Labs    12/07/18 0537  NA 138  K 3.9  CL 103  CO2 28  GLUCOSE 121*  BUN 17  CREATININE 1.07  CALCIUM 8.4*   PT/INR No results for input(s): LABPROT, INR in the last 72 hours. CMP     Component Value Date/Time   NA 138 12/07/2018 0537   K 3.9 12/07/2018 0537   CL 103 12/07/2018 0537   CO2 28 12/07/2018 0537   GLUCOSE 121 (H) 12/07/2018 0537   BUN 17 12/07/2018 0537   CREATININE 1.07 12/07/2018 0537   CALCIUM 8.4 (L) 12/07/2018 0537   PROT 6.7 02/14/2012 2215   ALBUMIN 4.0 02/14/2012 2215   AST 14 02/14/2012 2215   ALT 13 02/14/2012 2215   ALKPHOS 67 02/14/2012 2215   BILITOT 0.3 02/14/2012 2215   GFRNONAA >60 12/07/2018 0537   GFRAA >60 12/07/2018 0537   Lipase  No results found for: LIPASE     Studies/Results: No results found.  Anti-infectives: Anti-infectives (From admission, onward)    Start     Dose/Rate Route Frequency Ordered Stop   12/06/18 1600  piperacillin-tazobactam (ZOSYN) IVPB 3.375 g     3.375 g 12.5 mL/hr over 240 Minutes Intravenous Every 8 hours 12/06/18 1458     12/06/18 0546  ceFAZolin (ANCEF) IVPB 2g/100 mL premix     2 g 200 mL/hr over 30 Minutes Intravenous 30 min pre-op 12/06/18 0546 12/06/18 1206   12/06/18 0000  sulfamethoxazole-trimethoprim (BACTRIM DS,SEPTRA DS) 800-160 MG tablet     1 tablet Oral 2 times daily 12/06/18 0732         Assessment/Plan 1. Prostate cancer S/p Robotic assisted laparoscopic prostatectomy with bilateral pelvic lymph node dissection Dr. Gloriann Loan 1/27  2. Intraoperative rectal injury S/p Primary repair of low rectal injury and rigid proctoscopy 1/27 Dr. Barry Dienes - POD 2 - Ok for clear liquids but advised patient to take these slowly, stop if any n/v. Encouraged more ambulation/OOB to chair today. Nothing per rectum. abx per urology. Repeat labs in AM.  ID - zosyn 1/27>> FEN - IVF, CLD VTE - SCDs, lovenox Foley - in place   LOS: 0 days    Wellington Hampshire , [facility] Surgery 12/08/2018, 8:21 AM Pager: [phone] Mon-Thurs 7:00 am-4:30 pm Fri 7:00 am -11:30 AM Sat-Sun 7:00 am-11:30 am

## 2018-12-08 NOTE — Progress Notes (Signed)
2 Days Post-Op Subjective: Patient reports feeling better.  Denies N/V.  Ambulating more and staying in the chair more. Diet adv to clears this am. No flatus yet.  AF. JP output decreasing.  Objective: Vital signs in last 24 hours: Temp:  [98.1 F (36.7 C)-99.1 F (37.3 C)] 98.1 F (36.7 C) (01/29 0515) Pulse Rate:  [79-90] 90 (01/29 0515) Resp:  [18] 18 (01/29 0515) BP: (109-145)/(70-93) 145/93 (01/29 0515) SpO2:  [93 %-96 %] 95 % (01/29 0515)  Intake/Output from previous day: 01/28 0701 - 01/29 0700 In: 2636.5 [P.O.:240; I.V.:2258.3; IV Piggyback:138.2] Out: 1610 [RUEAV:4098; Drains:170] Intake/Output this shift: No intake/output data recorded.  Physical Exam:  General:alert, cooperative and no distress  Lungs: normal resp effort  GI: soft, mildly tender, bowel sounds present Incisions: c/d/i JP with serosang drainage Foley with pink urine   Lab Results: Recent Labs    12/06/18 1321 12/07/18 0537 12/08/18 0616  HGB 13.0 11.6* 10.9*  HCT 40.7 36.4* 33.6*   BMET Recent Labs    12/07/18 0537  NA 138  K 3.9  CL 103  CO2 28  GLUCOSE 121*  BUN 17  CREATININE 1.07  CALCIUM 8.4*   No results for input(s): LABPT, INR in the last 72 hours. No results for input(s): LABURIN in the last 72 hours. Results for orders placed or performed during the hospital encounter of 12/01/16  Urine culture     Status: Abnormal   Collection Time: 12/01/16 11:55 AM  Result Value Ref Range Status   Specimen Description URINE, RANDOM  Final   Special Requests NONE  Final   Culture (A)  Final    <10,000 COLONIES/mL INSIGNIFICANT GROWTH Performed at [facility], [address], [place], [place] [address]    Report Status 12/02/2016 FINAL  Final    Studies/Results: No results found.  Assessment/Plan: 2 Days Post-Op Procedure(s) (LRB): XI ROBOTIC ASSISTED LAPAROSCOPIC RADICAL PROSTATECTOMY (N/A) PELVIC LYMPH NODE DISSECTION (Bilateral) ROBOTIC PRIMARY REPAIR OF RECTAL  INJURY WITH PROCTOSCOPY. (N/A)  Improving   Continue ambulation and IS  Continue clears until flatus  Stool softner QD  Continue scheduled ditropan   D/C Zosyn  JP output appropriate.  Leave drain for now  Hemoglobin down 4g from pre-op. Drop expected with surgery and hemodilution.  Repeat CBC in am  WBC slightly up from pre-op. AF.  Repeat in am   Cont Lovenox and SCDs for DVT prophy   LOS: 0 days   Debbrah Alar 12/08/2018, 9:48 AM

## 2018-12-08 NOTE — Progress Notes (Signed)
Urology Inpatient Progress Report  PROSTATE CANCER  Procedure(s): XI ROBOTIC ASSISTED LAPAROSCOPIC RADICAL PROSTATECTOMY PELVIC LYMPH NODE DISSECTION ROBOTIC PRIMARY REPAIR OF RECTAL INJURY WITH PROCTOSCOPY.  2 Days Post-Op   Intv/Subj: No acute events overnight. Patient is without complaint. Patient ambulating well.  No flatus or bowel movement.  JP output has been decreasing.  About the expected output after his surgery.    Active Problems:   Prostate cancer ([place])  Current Facility-Administered Medications  Medication Dose Route Frequency Provider Last Rate Last Dose  . acetaminophen (TYLENOL) tablet 650 mg  650 mg Oral Q4H PRN Debbrah Alar, PA-C   650 mg at 12/08/18 0815  . diazepam (VALIUM) tablet 5 mg  5 mg Oral Q6H PRN Clydene Laming, Case M, MD   5 mg at 12/07/18 2340  . diphenhydrAMINE (BENADRYL) injection 12.5 mg  12.5 mg Intravenous Q6H PRN Dancy, Amanda, PA-C       Or  . diphenhydrAMINE (BENADRYL) 12.5 MG/5ML elixir 12.5 mg  12.5 mg Oral Q6H PRN Dancy, Amanda, PA-C      . docusate sodium (COLACE) capsule 100 mg  100 mg Oral Daily Debbrah Alar, PA-C   100 mg at 12/07/18 1532  . enoxaparin (LOVENOX) injection 40 mg  40 mg Subcutaneous Q24H Wood, Case M, MD   40 mg at 12/08/18 0815  . HYDROmorphone (DILAUDID) injection 0.5-1 mg  0.5-1 mg Intravenous Q2H PRN Debbrah Alar, PA-C   1 mg at 12/08/18 0834  . neomycin-bacitracin-polymyxin (NEOSPORIN) ointment packet 1 application  1 application Topical TID PRN Debbrah Alar, PA-C      . ondansetron (ZOFRAN) injection 4 mg  4 mg Intravenous Q4H PRN Debbrah Alar, PA-C   4 mg at 12/08/18 0834  . oxybutynin (DITROPAN) tablet 5 mg  5 mg Oral TID Marton Redwood III, MD   5 mg at 12/07/18 2020  . oxyCODONE (Oxy IR/ROXICODONE) immediate release tablet 5 mg  5 mg Oral Q4H PRN Debbrah Alar, PA-C   5 mg at 12/08/18 0410  . pantoprazole (PROTONIX) EC tablet 80 mg  80 mg Oral Q1200 Dancy, Amanda, PA-C   80 mg at 12/07/18 1249  . simethicone  (MYLICON) chewable tablet 80 mg  80 mg Oral QID PRN Wellington Hampshire, PA-C   80 mg at 12/08/18 0411     Objective: Vital: Vitals:   12/07/18 0931 12/07/18 1509 12/07/18 2110 12/08/18 0515  BP: 125/77 109/70 116/81 (!) 145/93  Pulse: 79 79 81 90  Resp: 18 18 18 18   Temp: 98.4 F (36.9 C) 98.5 F (36.9 C) 99.1 F (37.3 C) 98.1 F (36.7 C)  TempSrc: Oral Oral Oral Oral  SpO2: 100% 96% 93% 95%  Weight:      Height:       I/Os: I/O last 3 completed shifts: In: 4098 [P.O.:360; I.V.:3503.3; IV Piggyback:234.7] Out: 2665 [Urine:2425; Drains:240]  Physical Exam:  General: Patient is in no apparent distress Lungs: Normal respiratory effort, chest expands symmetrically. GI: Incisions are c/d/i. The abdomen is soft and mildly tender without mass. JP drain with serosanguinous drainage Foley: Draining clear yellow urine Ext: lower extremities symmetric  Lab Results: Recent Labs    12/06/18 1321 12/07/18 0537 12/08/18 0616  WBC  --   --  12.9*  HGB 13.0 11.6* 10.9*  HCT 40.7 36.4* 33.6*   Recent Labs    12/07/18 0537  NA 138  K 3.9  CL 103  CO2 28  GLUCOSE 121*  BUN 17  CREATININE 1.07  CALCIUM 8.4*  No results for input(s): LABPT, INR in the last 72 hours. No results for input(s): LABURIN in the last 72 hours. Results for orders placed or performed during the hospital encounter of 12/01/16  Urine culture     Status: Abnormal   Collection Time: 12/01/16 11:55 AM  Result Value Ref Range Status   Specimen Description URINE, RANDOM  Final   Special Requests NONE  Final   Culture (A)  Final    <10,000 COLONIES/mL INSIGNIFICANT GROWTH Performed at [facility], [address], [place],  [address]    Report Status 12/02/2016 FINAL  Final    Studies/Results: No results found.  Assessment: Prostate cancer Intraoperative rectal injury Procedure(s): XI ROBOTIC ASSISTED LAPAROSCOPIC RADICAL PROSTATECTOMY PELVIC LYMPH NODE DISSECTION ROBOTIC PRIMARY  REPAIR OF RECTAL INJURY WITH PROCTOSCOPY., 2 Days Post-Op  doing well.  Plan: Advance diet slowly.  We will start clears today. Continue Lovenox DVT prophylaxis, SCDs Out of bed to ambulate CBC in the morning Nothing per rectum sTop antibiotics today Continue JP for now. Continue scheduled Ditropan for bladder spasms Try to limit narcotics but also need good pain control Continue bowel regimen He will need a Foley catheter for 2 weeks with a cystogram prior to removal   Link Snuffer, MD Urology 12/08/2018, 9:50 AM

## 2018-12-09 DIAGNOSIS — Z87442 Personal history of urinary calculi: Secondary | ICD-10-CM | POA: Diagnosis not present

## 2018-12-09 DIAGNOSIS — K9171 Accidental puncture and laceration of a digestive system organ or structure during a digestive system procedure: Secondary | ICD-10-CM | POA: Diagnosis not present

## 2018-12-09 DIAGNOSIS — C61 Malignant neoplasm of prostate: Secondary | ICD-10-CM | POA: Diagnosis present

## 2018-12-09 DIAGNOSIS — C775 Secondary and unspecified malignant neoplasm of intrapelvic lymph nodes: Secondary | ICD-10-CM | POA: Diagnosis present

## 2018-12-09 DIAGNOSIS — N3289 Other specified disorders of bladder: Secondary | ICD-10-CM | POA: Diagnosis not present

## 2018-12-09 DIAGNOSIS — Y836 Removal of other organ (partial) (total) as the cause of abnormal reaction of the patient, or of later complication, without mention of misadventure at the time of the procedure: Secondary | ICD-10-CM | POA: Diagnosis not present

## 2018-12-09 DIAGNOSIS — Z79899 Other long term (current) drug therapy: Secondary | ICD-10-CM | POA: Diagnosis not present

## 2018-12-09 DIAGNOSIS — K219 Gastro-esophageal reflux disease without esophagitis: Secondary | ICD-10-CM | POA: Diagnosis present

## 2018-12-09 DIAGNOSIS — K567 Ileus, unspecified: Secondary | ICD-10-CM | POA: Diagnosis not present

## 2018-12-09 DIAGNOSIS — Y92234 Operating room of hospital as the place of occurrence of the external cause: Secondary | ICD-10-CM | POA: Diagnosis not present

## 2018-12-09 LAB — CBC
HCT: 32.2 % — ABNORMAL LOW (ref 39.0–52.0)
Hemoglobin: 10.2 g/dL — ABNORMAL LOW (ref 13.0–17.0)
MCH: 30.4 pg (ref 26.0–34.0)
MCHC: 31.7 g/dL (ref 30.0–36.0)
MCV: 96.1 fL (ref 80.0–100.0)
PLATELETS: 182 10*3/uL (ref 150–400)
RBC: 3.35 MIL/uL — ABNORMAL LOW (ref 4.22–5.81)
RDW: 12 % (ref 11.5–15.5)
WBC: 9 10*3/uL (ref 4.0–10.5)
nRBC: 0 % (ref 0.0–0.2)

## 2018-12-09 MED ORDER — BOOST / RESOURCE BREEZE PO LIQD CUSTOM
1.0000 | Freq: Two times a day (BID) | ORAL | Status: DC
  Administered 2018-12-09 – 2018-12-11 (×6): 1 via ORAL

## 2018-12-09 NOTE — Progress Notes (Signed)
  [facility] Progress Note  3 Days Post-Op  Subjective: CC-  Up in chair. Reports less pain today. Tolerating clear liquids. Denies n/v. Passing flatus. No BM.  Objective: Vital signs in last 24 hours: Temp:  [97.9 F (36.6 C)-98.9 F (37.2 C)] 97.9 F (36.6 C) (01/30 0646) Pulse Rate:  [77-85] 77 (01/30 0646) Resp:  [16] 16 (01/30 0646) BP: (94-135)/(64-94) 127/94 (01/30 0646) SpO2:  [96 %-98 %] 98 % (01/30 0646) Last BM Date: 12/06/18  Intake/Output from previous day: 01/29 0701 - 01/30 0700 In: 540 [P.O.:540] Out: 1160 [Urine:975; Drains:185] Intake/Output this shift: No intake/output data recorded.  PE: Gen: Alert, NAD Pulm: effort normal Abd: Soft,mild distension,+BS,lap incisions cdi, JP drain with bloody/serosanguinous output (185cc output recorded) Skin: warm and dry  Lab Results:  Recent Labs    12/08/18 0616 12/09/18 0558  WBC 12.9* 9.0  HGB 10.9* 10.2*  HCT 33.6* 32.2*  PLT 189 182   BMET Recent Labs    12/07/18 0537  NA 138  K 3.9  CL 103  CO2 28  GLUCOSE 121*  BUN 17  CREATININE 1.07  CALCIUM 8.4*   PT/INR No results for input(s): LABPROT, INR in the last 72 hours. CMP     Component Value Date/Time   NA 138 12/07/2018 0537   K 3.9 12/07/2018 0537   CL 103 12/07/2018 0537   CO2 28 12/07/2018 0537   GLUCOSE 121 (H) 12/07/2018 0537   BUN 17 12/07/2018 0537   CREATININE 1.07 12/07/2018 0537   CALCIUM 8.4 (L) 12/07/2018 0537   PROT 6.7 02/14/2012 2215   ALBUMIN 4.0 02/14/2012 2215   AST 14 02/14/2012 2215   ALT 13 02/14/2012 2215   ALKPHOS 67 02/14/2012 2215   BILITOT 0.3 02/14/2012 2215   GFRNONAA >60 12/07/2018 0537   GFRAA >60 12/07/2018 0537   Lipase  No results found for: LIPASE     Studies/Results: No results found.  Anti-infectives: Anti-infectives (From admission, onward)   Start     Dose/Rate Route Frequency Ordered Stop   12/06/18 1600  piperacillin-tazobactam (ZOSYN) IVPB 3.375 g  Status:   Discontinued     3.375 g 12.5 mL/hr over 240 Minutes Intravenous Every 8 hours 12/06/18 1458 12/08/18 0920   12/06/18 0546  ceFAZolin (ANCEF) IVPB 2g/100 mL premix     2 g 200 mL/hr over 30 Minutes Intravenous 30 min pre-op 12/06/18 0546 12/06/18 1206   12/06/18 0000  sulfamethoxazole-trimethoprim (BACTRIM DS,SEPTRA DS) 800-160 MG tablet     1 tablet Oral 2 times daily 12/06/18 0732         Assessment/Plan 1. Prostate cancer S/pRobotic assisted laparoscopic prostatectomy with bilateral pelvic lymph node dissectionDr. Bell 1/27  2. Intraoperative rectal injury S/pPrimary repair of low rectal injury and rigid proctoscopy1/27 Dr. Barry Dienes - POD 3 - Ileus resolving. Advance to full liquids, add Boost. Continue ambulating. May be able to advance to soft diet tomorrow AM and discharge in afternoon. Nothing per rectum.  ID -zosyn 1/27>>1/29, bactrim 1/27>> FEN -IVF, FLD, Boost VTE -SCDs, lovenox Foley -in place   LOS: 0 days    Allen Tran , [facility] 12/09/2018, 9:21 AM Pager: [phone] Mon-Thurs 7:00 am-4:30 pm Fri 7:00 am -11:30 AM Sat-Sun 7:00 am-11:30 am

## 2018-12-09 NOTE — Progress Notes (Signed)
Urology Inpatient Progress Report  PROSTATE CANCER  Procedure(s): XI ROBOTIC ASSISTED LAPAROSCOPIC RADICAL PROSTATECTOMY PELVIC LYMPH NODE DISSECTION ROBOTIC PRIMARY REPAIR OF RECTAL INJURY WITH PROCTOSCOPY.  3 Days Post-Op   Intv/Subj: No acute events overnight. Patient ambulating well.  +flatus, no BM JP output stable   Active Problems:   Prostate cancer ([place])  Current Facility-Administered Medications  Medication Dose Route Frequency Provider Last Rate Last Dose  . acetaminophen (TYLENOL) tablet 650 mg  650 mg Oral Q4H PRN Debbrah Alar, PA-C   650 mg at 12/09/18 0414  . diazepam (VALIUM) tablet 5 mg  5 mg Oral Q6H PRN Clydene Laming, Natalie Mceuen M, MD   5 mg at 12/07/18 2340  . diphenhydrAMINE (BENADRYL) injection 12.5 mg  12.5 mg Intravenous Q6H PRN Dancy, Amanda, PA-C       Or  . diphenhydrAMINE (BENADRYL) 12.5 MG/5ML elixir 12.5 mg  12.5 mg Oral Q6H PRN Dancy, Amanda, PA-C      . docusate sodium (COLACE) capsule 100 mg  100 mg Oral Daily Debbrah Alar, PA-C   100 mg at 12/09/18 0950  . enoxaparin (LOVENOX) injection 40 mg  40 mg Subcutaneous Q24H Najib Colmenares M, MD   40 mg at 12/09/18 0950  . feeding supplement (BOOST / RESOURCE BREEZE) liquid 1 Container  1 Container Oral BID BM Meuth, Brooke A, PA-C   1 Container at 12/09/18 [phone]  . HYDROmorphone (DILAUDID) injection 0.5-1 mg  0.5-1 mg Intravenous Q2H PRN Debbrah Alar, PA-C   1 mg at 12/08/18 0834  . neomycin-bacitracin-polymyxin (NEOSPORIN) ointment packet 1 application  1 application Topical TID PRN Debbrah Alar, PA-C      . ondansetron (ZOFRAN) injection 4 mg  4 mg Intravenous Q4H PRN Debbrah Alar, PA-C   4 mg at 12/08/18 0834  . oxybutynin (DITROPAN) tablet 5 mg  5 mg Oral TID Marton Redwood III, MD   5 mg at 12/09/18 0949  . oxyCODONE (Oxy IR/ROXICODONE) immediate release tablet 5 mg  5 mg Oral Q4H PRN Debbrah Alar, PA-C   5 mg at 12/08/18 1733  . pantoprazole (PROTONIX) EC tablet 80 mg  80 mg Oral Q1200 Dancy, Amanda, PA-C   80 mg  at 12/08/18 1139  . simethicone (MYLICON) chewable tablet 80 mg  80 mg Oral QID PRN Margie Billet A, PA-C   80 mg at 12/08/18 0411     Objective: Vital: Vitals:   12/08/18 1320 12/08/18 1701 12/08/18 2146 12/09/18 0646  BP: 94/64 135/80 111/66 (!) 127/94  Pulse: 85 78 79 77  Resp: 16  16 16   Temp: 98.7 F (37.1 C)  98.9 F (37.2 C) 97.9 F (36.6 C)  TempSrc: Oral  Oral Oral  SpO2: 97%  96% 98%  Weight:      Height:       I/Os: I/O last 3 completed shifts: In: 2319.3 [P.O.:780; I.V.:1401.1; IV Piggyback:138.2] Out: 3710 [Urine:1600; Drains:265]  Physical Exam:  General: Patient is in no apparent distress Lungs: Normal respiratory effort, chest expands symmetrically. GI: Incisions are c/d/i. The abdomen is soft and mildly tender without mass. JP drain with serosanguinous drainage Foley: Draining clear yellow urine Ext: lower extremities symmetric  Lab Results: Recent Labs    12/07/18 0537 12/08/18 0616 12/09/18 0558  WBC  --  12.9* 9.0  HGB 11.6* 10.9* 10.2*  HCT 36.4* 33.6* 32.2*   Recent Labs    12/07/18 0537  NA 138  K 3.9  CL 103  CO2 28  GLUCOSE 121*  BUN 17  CREATININE 1.07  CALCIUM 8.4*   No results for input(s): LABPT, INR in the last 72 hours. No results for input(s): LABURIN in the last 72 hours. Results for orders placed or performed during the hospital encounter of 12/01/16  Urine culture     Status: Abnormal   Collection Time: 12/01/16 11:55 AM  Result Value Ref Range Status   Specimen Description URINE, RANDOM  Final   Special Requests NONE  Final   Culture (A)  Final    <10,000 COLONIES/mL INSIGNIFICANT GROWTH Performed at [facility], [address] [place], [place] [address]    Report Status 12/02/2016 FINAL  Final    Studies/Results: No results found.  Assessment: Prostate cancer Intraoperative rectal injury Procedure(s): XI ROBOTIC ASSISTED LAPAROSCOPIC RADICAL PROSTATECTOMY PELVIC LYMPH NODE DISSECTION ROBOTIC  PRIMARY REPAIR OF RECTAL INJURY WITH PROCTOSCOPY., 3 Days Post-Op  doing well.  Plan: Full liquid diet, general surgery following Continue Lovenox DVT prophylaxis, SCDs Out of bed to ambulate CBC tomorrow Nothing per rectum Continue JP for now Continue scheduled Ditropan for bladder spasms Continue bowel regimen He will need a Foley catheter for 2 weeks with a cystogram prior to removal

## 2018-12-10 MED ORDER — POLYETHYLENE GLYCOL 3350 17 G PO PACK
17.0000 g | PACK | Freq: Every day | ORAL | Status: DC
  Administered 2018-12-10 – 2018-12-11 (×2): 17 g via ORAL
  Filled 2018-12-10 (×2): qty 1

## 2018-12-10 MED ORDER — OXYBUTYNIN CHLORIDE 5 MG PO TABS: 5.0000 mg | ORAL_TABLET | Freq: Three times a day (TID) | ORAL | Status: DC | PRN

## 2018-12-10 NOTE — Progress Notes (Signed)
Urology Inpatient Progress Report  PROSTATE CANCER  Procedure(s): XI ROBOTIC ASSISTED LAPAROSCOPIC RADICAL PROSTATECTOMY PELVIC LYMPH NODE DISSECTION ROBOTIC PRIMARY REPAIR OF RECTAL INJURY WITH PROCTOSCOPY.  4 Days Post-Op   Intv/Subj: No acute events overnight. Patient ambulating well.  +flatus regularly, no BM. JP output stable.   Active Problems:   Prostate cancer ([place])  Current Facility-Administered Medications  Medication Dose Route Frequency Provider Last Rate Last Dose  . acetaminophen (TYLENOL) tablet 650 mg  650 mg Oral Q4H PRN Debbrah Alar, PA-C   650 mg at 12/09/18 0414  . diazepam (VALIUM) tablet 5 mg  5 mg Oral Q6H PRN Clydene Laming, Case M, MD   5 mg at 12/07/18 2340  . diphenhydrAMINE (BENADRYL) injection 12.5 mg  12.5 mg Intravenous Q6H PRN Dancy, Amanda, PA-C       Or  . diphenhydrAMINE (BENADRYL) 12.5 MG/5ML elixir 12.5 mg  12.5 mg Oral Q6H PRN Dancy, Amanda, PA-C      . docusate sodium (COLACE) capsule 100 mg  100 mg Oral Daily Debbrah Alar, PA-C   100 mg at 12/09/18 0950  . enoxaparin (LOVENOX) injection 40 mg  40 mg Subcutaneous Q24H Wood, Case M, MD   40 mg at 12/10/18 0750  . feeding supplement (BOOST / RESOURCE BREEZE) liquid 1 Container  1 Container Oral BID BM Meuth, Brooke A, PA-C   1 Container at 12/09/18 1634  . HYDROmorphone (DILAUDID) injection 0.5-1 mg  0.5-1 mg Intravenous Q2H PRN Debbrah Alar, PA-C   1 mg at 12/08/18 0834  . neomycin-bacitracin-polymyxin (NEOSPORIN) ointment packet 1 application  1 application Topical TID PRN Debbrah Alar, PA-C      . ondansetron (ZOFRAN) injection 4 mg  4 mg Intravenous Q4H PRN Debbrah Alar, PA-C   4 mg at 12/08/18 0834  . oxybutynin (DITROPAN) tablet 5 mg  5 mg Oral TID Marton Redwood III, MD   5 mg at 12/09/18 2203  . oxyCODONE (Oxy IR/ROXICODONE) immediate release tablet 5 mg  5 mg Oral Q4H PRN Debbrah Alar, PA-C   5 mg at 12/09/18 2203  . pantoprazole (PROTONIX) EC tablet 80 mg  80 mg Oral Q1200 Dancy, Amanda,  PA-C   80 mg at 12/09/18 1322  . simethicone (MYLICON) chewable tablet 80 mg  80 mg Oral QID PRN Wellington Hampshire, PA-C   80 mg at 12/08/18 0411     Objective: Vital: Vitals:   12/09/18 0646 12/09/18 1428 12/09/18 2128 12/10/18 0550  BP: (!) 127/94 104/70 111/70 124/83  Pulse: 77 75 78 72  Resp: 16 14 16 16   Temp: 97.9 F (36.6 C) 98.1 F (36.7 C) 98 F (36.7 C) 98.4 F (36.9 C)  TempSrc: Oral Oral Oral Oral  SpO2: 98% 99% 100% 98%  Weight:      Height:       I/Os: I/O last 3 completed shifts: In: 300 [P.O.:300] Out: 1660 [Urine:1250; Drains:225]  Physical Exam:  General: Patient is in no apparent distress Lungs: Normal respiratory effort, chest expands symmetrically. GI: Incisions are c/d/i. The abdomen is soft and mildly tender without mass. JP drain with scant serosanguinous drainage Foley: Draining clear yellow urine Ext: lower extremities symmetric  Lab Results: Recent Labs    12/08/18 0616 12/09/18 0558  WBC 12.9* 9.0  HGB 10.9* 10.2*  HCT 33.6* 32.2*   No results for input(s): NA, K, CL, CO2, GLUCOSE, BUN, CREATININE, CALCIUM in the last 72 hours. No results for input(s): LABPT, INR in the last 72 hours. No results for  input(s): LABURIN in the last 72 hours. Results for orders placed or performed during the hospital encounter of 12/01/16  Urine culture     Status: Abnormal   Collection Time: 12/01/16 11:55 AM  Result Value Ref Range Status   Specimen Description URINE, RANDOM  Final   Special Requests NONE  Final   Culture (A)  Final    <10,000 COLONIES/mL INSIGNIFICANT GROWTH Performed at [facility], [address], [place], [place] [address]    Report Status 12/02/2016 FINAL  Final    Studies/Results: No results found.  Assessment: Prostate cancer Intraoperative rectal injury Procedure(s): XI ROBOTIC ASSISTED LAPAROSCOPIC RADICAL PROSTATECTOMY PELVIC LYMPH NODE DISSECTION ROBOTIC PRIMARY REPAIR OF RECTAL INJURY WITH PROCTOSCOPY., 4  Days Post-Op  doing well.  Plan: Soft diet, general surgery following and recommend patient remains admitted until BM Continue Lovenox DVT prophylaxis, SCDs, OOB Nothing per rectum Continue JP for now Continue scheduled Ditropan for bladder spasms Continue bowel regimen He will need a Foley catheter for 2 weeks with a cystogram prior to removal

## 2018-12-10 NOTE — Progress Notes (Signed)
  [facility] Progress Note  4 Days Post-Op  Subjective: CC-  Up in chair. Feeling a little better today than yesterday. Tolerating full liquids but does not have much of an appetite. Denies n/v. Passing flatus, no BM. Ambulated in the halls a few times yesterday, has not yet done so today.  Objective: Vital signs in last 24 hours: Temp:  [98 F (36.7 C)-98.4 F (36.9 C)] 98.4 F (36.9 C) (01/31 0550) Pulse Rate:  [72-78] 72 (01/31 0550) Resp:  [14-16] 16 (01/31 0550) BP: (104-124)/(70-83) 124/83 (01/31 0550) SpO2:  [98 %-100 %] 98 % (01/31 0550) Last BM Date: 12/06/18  Intake/Output from previous day: 01/30 0701 - 01/31 0700 In: 120 [P.O.:120] Out: 1000 [Urine:825; Drains:175] Intake/Output this shift: No intake/output data recorded.  PE: Gen: Alert, NAD Pulm: effort normal Abd: Soft, nondistended, +BS,lap incisions cdi, JP drain mostly serous (175cc output recorded) Skin: warm and dry  Lab Results:  Recent Labs    12/08/18 0616 12/09/18 0558  WBC 12.9* 9.0  HGB 10.9* 10.2*  HCT 33.6* 32.2*  PLT 189 182   BMET No results for input(s): NA, K, CL, CO2, GLUCOSE, BUN, CREATININE, CALCIUM in the last 72 hours. PT/INR No results for input(s): LABPROT, INR in the last 72 hours. CMP     Component Value Date/Time   NA 138 12/07/2018 0537   K 3.9 12/07/2018 0537   CL 103 12/07/2018 0537   CO2 28 12/07/2018 0537   GLUCOSE 121 (H) 12/07/2018 0537   BUN 17 12/07/2018 0537   CREATININE 1.07 12/07/2018 0537   CALCIUM 8.4 (L) 12/07/2018 0537   PROT 6.7 02/14/2012 2215   ALBUMIN 4.0 02/14/2012 2215   AST 14 02/14/2012 2215   ALT 13 02/14/2012 2215   ALKPHOS 67 02/14/2012 2215   BILITOT 0.3 02/14/2012 2215   GFRNONAA >60 12/07/2018 0537   GFRAA >60 12/07/2018 0537   Lipase  No results found for: LIPASE     Studies/Results: No results found.  Anti-infectives: Anti-infectives (From admission, onward)   Start     Dose/Rate Route Frequency  Ordered Stop   12/06/18 1600  piperacillin-tazobactam (ZOSYN) IVPB 3.375 g  Status:  Discontinued     3.375 g 12.5 mL/hr over 240 Minutes Intravenous Every 8 hours 12/06/18 1458 12/08/18 0920   12/06/18 0546  ceFAZolin (ANCEF) IVPB 2g/100 mL premix     2 g 200 mL/hr over 30 Minutes Intravenous 30 min pre-op 12/06/18 0546 12/06/18 1206   12/06/18 0000  sulfamethoxazole-trimethoprim (BACTRIM DS,SEPTRA DS) 800-160 MG tablet     1 tablet Oral 2 times daily 12/06/18 0732         Assessment/Plan 1. Prostate cancer S/pRobotic assisted laparoscopic prostatectomy with bilateral pelvic lymph node dissectionDr. Bell 1/27  2. Intraoperative rectal injury S/pPrimary repair of low rectal injury and rigid proctoscopy1/27 Dr. Barry Dienes - POD4 -Advance to soft diet. Add daily miralax. Continue ambulating. Nothing per rectum. If patient is tolerating diet and has a BM he is stable for discharge from surgical standpoint. Ok to d/c JP drain prior to discharge. F/u info on AVS.  ID -zosyn 1/27>>1/29, bactrim 1/27>> FEN -IVF, soft diet, Boost VTE -SCDs, lovenox Foley -in place   LOS: 1 day    Wellington Hampshire , [facility] 12/10/2018, 9:01 AM Pager: [phone] Mon-Thurs 7:00 am-4:30 pm Fri 7:00 am -11:30 AM Sat-Sun 7:00 am-11:30 am

## 2018-12-11 LAB — CREATININE, FLUID (PLEURAL, PERITONEAL, JP DRAINAGE): Creat, Fluid: 0.7 mg/dL

## 2018-12-11 NOTE — Progress Notes (Signed)
5 Days Post-Op   Subjective/Chief Complaint: No complaints. Passing flatus   Objective: Vital signs in last 24 hours: Temp:  [98 F (36.7 C)-98.6 F (37 C)] 98 F (36.7 C) (02/01 0431) Pulse Rate:  [71-94] 71 (02/01 0431) Resp:  [16] 16 (02/01 0431) BP: (107-124)/(73-84) 124/84 (02/01 0431) SpO2:  [97 %-99 %] 97 % (02/01 0431) Last BM Date: 12/06/18  Intake/Output from previous day: 01/31 0701 - 02/01 0700 In: 120 [P.O.:120] Out: 680 [Urine:600; Drains:80] Intake/Output this shift: No intake/output data recorded.  General appearance: alert and cooperative Resp: clear to auscultation bilaterally Cardio: regular rate and rhythm GI: soft, mild tenderness  Lab Results:  Recent Labs    12/09/18 0558  WBC 9.0  HGB 10.2*  HCT 32.2*  PLT 182   BMET No results for input(s): NA, K, CL, CO2, GLUCOSE, BUN, CREATININE, CALCIUM in the last 72 hours. PT/INR No results for input(s): LABPROT, INR in the last 72 hours. ABG No results for input(s): PHART, HCO3 in the last 72 hours.  Invalid input(s): PCO2, PO2  Studies/Results: No results found.  Anti-infectives: Anti-infectives (From admission, onward)   Start     Dose/Rate Route Frequency Ordered Stop   12/06/18 1600  piperacillin-tazobactam (ZOSYN) IVPB 3.375 g  Status:  Discontinued     3.375 g 12.5 mL/hr over 240 Minutes Intravenous Every 8 hours 12/06/18 1458 12/08/18 0920   12/06/18 0546  ceFAZolin (ANCEF) IVPB 2g/100 mL premix     2 g 200 mL/hr over 30 Minutes Intravenous 30 min pre-op 12/06/18 0546 12/06/18 1206   12/06/18 0000  sulfamethoxazole-trimethoprim (BACTRIM DS,SEPTRA DS) 800-160 MG tablet     1 tablet Oral 2 times daily 12/06/18 0732        Assessment/Plan: s/p Procedure(s): XI ROBOTIC ASSISTED LAPAROSCOPIC RADICAL PROSTATECTOMY (N/A) PELVIC LYMPH NODE DISSECTION (Bilateral) ROBOTIC PRIMARY REPAIR OF RECTAL INJURY WITH PROCTOSCOPY. (N/A) Advance diet  abd seems benign. [place] for d/c from surgical  standpoint 1. Prostate cancer S/pRobotic assisted laparoscopic prostatectomy with bilateral pelvic lymph node dissectionDr. Bell 1/27  2. Intraoperative rectal injury S/pPrimary repair of low rectal injury and rigid proctoscopy1/27 Dr. Barry Dienes - POD4 -Advance to soft diet. Add daily miralax. Continue ambulating. Nothing per rectum. If patient is tolerating diet and has a BM he is stable for discharge from surgical standpoint. Ok to d/c JP drain prior to discharge. F/u info on AVS.  ID -zosyn 1/27>>1/29, bactrim 1/27>> FEN -IVF, soft diet, Boost VTE -SCDs, lovenox  LOS: 2 days    Allen Tran 12/11/2018

## 2018-12-11 NOTE — Progress Notes (Signed)
5 Days Post-Op Subjective: Patient reports that he did have a bowel movement earlier this morning.  No abdominal discomfort.  Objective: Vital signs in last 24 hours: Temp:  [98 F (36.7 C)-98.6 F (37 C)] 98 F (36.7 C) (02/01 0431) Pulse Rate:  [71-94] 71 (02/01 0431) Resp:  [16] 16 (02/01 0431) BP: (107-124)/(73-84) 124/84 (02/01 0431) SpO2:  [97 %-99 %] 97 % (02/01 0431)  Intake/Output from previous day: 01/31 0701 - 02/01 0700 In: 120 [P.O.:120] Out: 680 [Urine:600; Drains:80] Intake/Output this shift: No intake/output data recorded.  Physical Exam:  Constitutional: Vital signs reviewed. WD WN in NAD   Eyes: PERRL, No scleral icterus.   Cardiovascular: RRR Pulmonary/Chest: Normal effort Abdominal: Soft. Non-tender, nondistended.  Port sites all healing well without evidence of infection Extremities: No cyanosis or edema   Lab Results: Recent Labs    12/09/18 0558  HGB 10.2*  HCT 32.2*   BMET No results for input(s): NA, K, CL, CO2, GLUCOSE, BUN, CREATININE, CALCIUM in the last 72 hours. No results for input(s): LABPT, INR in the last 72 hours. No results for input(s): LABURIN in the last 72 hours. Results for orders placed or performed during the hospital encounter of 12/01/16  Urine culture     Status: Abnormal   Collection Time: 12/01/16 11:55 AM  Result Value Ref Range Status   Specimen Description URINE, RANDOM  Final   Special Requests NONE  Final   Culture (A)  Final    <10,000 COLONIES/mL INSIGNIFICANT GROWTH Performed at [facility], [address] [place], [place] [address]    Report Status 12/02/2016 FINAL  Final    Studies/Results: No results found.  Assessment/Plan:   Postoperative day #5.  Robotic assisted radical prostatectomy/rectal injury.  He is doing well.  He had a bowel movement today.    I will send JP fluid for BUN/creatinine.  If consistent with serum, we will remove drain and sent home.   LOS: 2 days   Jorja Loa 12/11/2018, 10:30 AM

## 2018-12-11 NOTE — Progress Notes (Signed)
Reviewed discharge information with patient and caregiver. Answered all questions. Patient/caregiver able to teach back medications and reasons to contact MD/911. Patient verbalizes importance of PCP/urology follow up appointment and to begin antibiotic the day before appt.Marland Kitchen Pt/wife demonstrates foley care and clean technique switching between leg and standard drainage bag. Foley care education provided.  Barbee Shropshire. Brigitte Pulse, RN

## 2018-12-11 NOTE — Progress Notes (Signed)
Pt with lots of gas this shift. Pt also had one small, loose bowel movement. No complaints of pain. Continues to ambulate in room. Will continue to monitor.

## 2018-12-23 NOTE — Discharge Summary (Signed)
Physician Discharge Summary  Patient ID: Allen Tran MRN: [id number] DOB/AGE: 1951/10/06 68 y.o.  Admit date: 12/06/2018 Discharge date: 12/11/2018  Admission Diagnoses:  Discharge Diagnoses:  Active Problems:   Prostate cancer [name] rectal injury intraoperative  Discharged Condition: good  Hospital Course: patient underwent a robotic-assisted laparoscopic prostatectomy with bilateral pelvic lymph node dissection.  This was complicated by a small rectal injury that was repaired by General surgery.  He progressed well and by the time of discharge he had a benign abdominal exam and had a bowel movement without problem.  He is discharged in stable condition  Consults: General surgery  Significant Diagnostic Studies: none  Treatments: surgery: as above  Discharge Exam: Blood pressure 124/84, pulse 71, temperature 98 F (36.7 C), temperature source Oral, resp. rate 16, height 5\' 10"  (1.778 m), weight 70.3 kg, SpO2 97 %. General appearance: alerto acute distress Foley catheter draining clear Abdomen soft, appropriately tender to palpation, nondistended  Disposition:    Allergies as of 12/11/2018   No Known Allergies     Medication List    TAKE these medications   esomeprazole 40 MG capsule Commonly known as:  NEXIUM Take 40 mg by mouth daily after breakfast.   HYDROcodone-acetaminophen 5-325 MG tablet Commonly known as:  NORCO Take 1-2 tablets by mouth every 6 (six) hours as needed.   sulfamethoxazole-trimethoprim 800-160 MG tablet Commonly known as:  BACTRIM DS,SEPTRA DS Take 1 tablet by mouth 2 (two) times daily. Start the day prior to foley removal appointment      Follow-up Information    Lucas Mallow, MD On 12/14/2018.   Specialty:  Urology Why:  at 8:30 Contact information: [place] [address] [phone]        Stark Klein, MD. Go on 12/27/2018.   Specialty:  General Surgery Why:  Your appointment is 12/27/2018 at 10:00am.   Please arrive 30 minutes prior to your appointment to check in and fill out paperwork. Bring photo ID and insurance information. Contact information: [address] [place] [address] [phone]           Signed: Marton Redwood, III 12/23/2018, 11:32 AM

## 2019-01-31 ENCOUNTER — Telehealth: Payer: Self-pay | Admitting: Radiation Oncology

## 2019-01-31 NOTE — Telephone Encounter (Signed)
Received voicemail message from patient's wife requesting return call. Phoned patient's home and spoke with wife. Wife expresses confusion about appointment date and time. Confirmed appointment for 02/02/2019 at 0930. Patient reports her husband only speaks [place] and she acts as his interpretor. Questioned if they were computer literate and she denied. Explained this RN will reach out to staff for best approach and phone her with further directions tomorrow. She verbalized understanding and expressed appreciation for the return call.

## 2019-02-01 ENCOUNTER — Ambulatory Visit: Payer: PRIVATE HEALTH INSURANCE | Admitting: Radiation Oncology

## 2019-02-01 ENCOUNTER — Telehealth: Payer: Self-pay | Admitting: Radiation Oncology

## 2019-02-01 ENCOUNTER — Ambulatory Visit: Payer: PRIVATE HEALTH INSURANCE

## 2019-02-01 NOTE — Telephone Encounter (Signed)
After speaking with Freeman Caldron, PA-C this RN phoned the patient's wife back. Wife had expressed concern yesterday that her husband relies heavily on her to interpret for him. She explained he speaks english but still requires her assistance at times. Patient is fluent in [place]. Explained that Wednesday, March 25 at 0930 we will have a teleconference since no visitors are allowed in the cancer center at this time. She has committed to this appointment and expressed her intent to have her husband available on speaker phone.

## 2019-02-02 ENCOUNTER — Ambulatory Visit
Admission: RE | Admit: 2019-02-02 | Discharge: 2019-02-02 | Disposition: A | Discharge status: Home / Self Care | Payer: PRIVATE HEALTH INSURANCE | Source: Ambulatory Visit | Attending: Radiation Oncology | Admitting: Radiation Oncology

## 2019-02-02 ENCOUNTER — Other Ambulatory Visit: Payer: Self-pay

## 2019-02-02 ENCOUNTER — Encounter: Payer: Self-pay | Admitting: Radiation Oncology

## 2019-02-02 DIAGNOSIS — Z79899 Other long term (current) drug therapy: Secondary | ICD-10-CM | POA: Diagnosis not present

## 2019-02-02 DIAGNOSIS — Z9889 Other specified postprocedural states: Secondary | ICD-10-CM | POA: Diagnosis not present

## 2019-02-02 DIAGNOSIS — K219 Gastro-esophageal reflux disease without esophagitis: Secondary | ICD-10-CM | POA: Insufficient documentation

## 2019-02-02 DIAGNOSIS — C61 Malignant neoplasm of prostate: Secondary | ICD-10-CM | POA: Diagnosis not present

## 2019-02-02 NOTE — Progress Notes (Signed)
GU Location of Tumor / Histology: prostatic adenocarcinoma  If Prostate Cancer, Gleason Score is (4 + 5) and PSA is (38)  Ines Bloomer presented in October 2019 with complaints of obstructive lower urinary tract symptoms to include intermittency, hesitancy, weak stream, nocturia, urgency and frequency.  Diagnosis 1. Fatty tissue, peri-prostatic fat BENIGN FIBROADIPOSE TISSUE 2. Lymph nodes, regional resection, right pelvic METASTATIC PROSTATIC ADENOCARCINOMA IN ONE OF NINE LYMPH NODES (1/9, 0.1 CM) 3. Lymph nodes, regional resection, left pelvic TEN BENIGN LYMPH NODES (0/10) 4. Prostate, radical resection PROSTATIC ADENOCARCINOMA, GLEASON SCORE 4+5=9 EXTRAPROSTATIC EXTENSION IS IDENTIFIED AT LEFT LOBE ANTERIOR, LATERAL AND POSTERIOR FROM APEX TO BASE AND BLADDER NECK AREA BILATERAL SEMINAL VESICLE INVASION IS PRESENT (PT3B) LYMPHOVASCULAR INVASION IS IDENTIFIED MARGINS OF RESECTION ARE POSITIVE WHERE EPE IS PRESENT LEFT BLADDER NECK (1.0 CM, GLEASON SCORE 4+4=8) SEMINAL VESICLE RESECTION MARGIN (1.5 CM, GLEASON SCORE 4+5=9) Microscopic Comment 4. PROSTATE GLAND: Procedure: Radical Prostatectomy Histologic Type: Acinar and ductal Histologic Grade, Grade Group and Gleason Score: 4+5=9 (grade group 5) Percentage of Pattern 4 in Gleason Score 7 (3+4, 4+3) Cancer (report if applicable): NA Extraprostatic Extension: Present Urinary Bladder Neck Invasion: Present Seminal Vesicle Invasion: Present Margins: Positive Treatment Effect: Negative Regional Lymph Nodes: NA No lymph nodes submitted or found 1 of 3 Duplicate copy FINAL for Manocchio, Calloway ([id number]) Microscopic Comment(continued) Number of Lymph Nodes Involved: 1 Number of Lymph Nodes Examined: 19 Pathologic Stage Classification (pTNM, AJCC 8th Edition): pT3b, pN1 Representative Tumor Block: 4H Comment(s): Adenocarcinoma extensively involves left lobe and right anterior at the apex, anterior and posterior at the base  of the gland (pattern 4=80%, pattern 5=10%). Tumor volume: about 70% ([id number]) Casimer Lanius MD Pathologist, Electronic Signature (Case signed 12/09/2018) Specimen Gross and Clinical Information  Past/Anticipated interventions by urology, if any: prostate biopsy, tamsulosin, bone scan (negative), CT scan (negative), robotic assisted laparoscopic prostatectomy with bilateral pelvic lymph node dissection on 12/06/2018, Lupron 22.5 good for 3 months administered on 01/13/19, Casodex  Past/Anticipated interventions by medical oncology, if any: no  Weight changes, if any: Denies. Reports following prostatectomy he gained weight.   Bowel/Bladder complaints, if any: Wears depends since surgery. Reports he requires only one depends per day with scant leakage. Denies dysuria or hematuria.   Nausea/Vomiting, if any: no  Pain issues, if any:  no  SAFETY ISSUES:  Prior radiation? no  Pacemaker/ICD? no  Possible current pregnancy? no, male patient  Is the patient on methotrexate? no  Current Complaints / other details: 68 year old male. Married with two sons. NKDA.

## 2019-02-02 NOTE — Progress Notes (Signed)
See progress note under physician encounter. 

## 2019-02-03 ENCOUNTER — Encounter: Payer: Self-pay | Admitting: Medical Oncology

## 2019-02-04 NOTE — Progress Notes (Signed)
Radiation Oncology         [phone] ________________________________  Initial outpatient Consultation - Conducted via telephone due to current COVID-19 concerns for limiting patient exposure  Name: Allen Tran MRN: [id number]  Date: 02/02/2019  DOB: 1950-12-16  [id number], No Pcp Per  Lucas Mallow, MD   REFERRING PHYSICIAN: Lucas Mallow, MD  DIAGNOSIS: 68 y.o. 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    ICD-10-CM   1. Prostate cancer [facility]) C61     HISTORY OF PRESENT ILLNESS: Allen Tran is a 68 y.o. male diagnosed with Gleason 4+5 adenocarcinoma of the prostate in 09/2018 with PSA of 38. Disease staging was performed with CT A/P and bone scan on 10/01/2018 which were negative for visceral or osseous metastatic disease.  After discussion with his urologist, he elected to proceed with RALP with BPLND which was performed on 12/06/2018 with Dr. Gloriann Loan. The procedure was complicated by a small rectal injury that was repaired at the time of surgery. Final surgical pathology confirmed pT3bN1 disease with extracapsular extension, positive margins, bilateral seminal vesicle involvement lymphovascular involvement and 1 of 9 lymph nodes positive.  His initial postoperative PSA on 01/13/2019 remained detectable at 1.33.  He was started on ADT with a 3 month Lupron injection and a 2-week course of Casodex documented on 01/13/2019 at [facility] urology.  So far, he reports tolerating the ADT very well without any significant fatigue or hot flashes.  He is scheduled for repeat PSA and follow up with Dr. Gloriann Loan in April 2020.  The patient reviewed the surgical pathology and postop PSA results with his urologist, and he has kindly been referred today for discussion of potential salvage radiation treatment options.  PREVIOUS RADIATION THERAPY: No  PAST MEDICAL HISTORY:  Past Medical  History:  Diagnosis Date   GERD (gastroesophageal reflux disease)    Kidney stone    Lower urinary tract symptoms (LUTS)    Prostate cancer ([place])       PAST SURGICAL HISTORY: Past Surgical History:  Procedure Laterality Date   CYSTO/ URETEROSCOPY LASER LITHOTRIPSY , BASKET STONE EXTRACTION/  URETERAL STENT PLACEMENT Right 08-08-2011    dr eskridge  @[facility]    CYSTOSCOPY W/ URETERAL STENT PLACEMENT Right 07-21-2011   dr eskridge  @[facility]    FINGER SURGERY Left 1990s   left thumb joint   PELVIC LYMPH NODE DISSECTION Bilateral 12/06/2018   Procedure: PELVIC LYMPH NODE DISSECTION;  Surgeon: Lucas Mallow, MD;  Location: [facility];  Service: Urology;  Laterality: Bilateral;   RECTAL EXAM UNDER ANESTHESIA N/A 12/06/2018   Procedure: ROBOTIC PRIMARY REPAIR OF RECTAL INJURY WITH PROCTOSCOPY.;  Surgeon: Stark Klein, MD;  Location: [facility];  Service: General;  Laterality: N/A;   ROBOT ASSISTED LAPAROSCOPIC RADICAL PROSTATECTOMY N/A 12/06/2018   Procedure: XI ROBOTIC ASSISTED LAPAROSCOPIC RADICAL PROSTATECTOMY;  Surgeon: Lucas Mallow, MD;  Location: [facility];  Service: Urology;  Laterality: N/A;    FAMILY HISTORY:  Family History  Problem Relation Age of Onset   Cancer Neg Hx    Prostate cancer Neg Hx    Breast cancer Neg Hx    Colon cancer Neg Hx    Pancreatic cancer Neg Hx     SOCIAL HISTORY:  Social History   Socioeconomic History   Marital status: Married    Spouse name: Not on file   Number of children: 2   Years of  education: Not on file   Highest education level: Not on file  Occupational History    Comment: forklift driver  Social Needs   Financial resource strain: Not on file   Food insecurity:    Worry: Not on file    Inability: Not on file   Transportation needs:    Medical: Not on file    Non-medical: Not on file  Tobacco Use   Smoking status: Never Smoker   Smokeless tobacco: Never Used  Substance and Sexual Activity   Alcohol use: No     Drug use: No   Sexual activity: Not Currently  Lifestyle   Physical activity:    Days per week: Not on file    Minutes per session: Not on file   Stress: Not on file  Relationships   Social connections:    Talks on phone: Not on file    Gets together: Not on file    Attends religious service: Not on file    Active member of club or organization: Not on file    Attends meetings of clubs or organizations: Not on file    Relationship status: Not on file   Intimate partner violence:    Fear of current or ex partner: Not on file    Emotionally abused: Not on file    Physically abused: Not on file    Forced sexual activity: Not on file  Other Topics Concern   Not on file  Social History Narrative   Not on file    ALLERGIES: Patient has no known allergies.  MEDICATIONS:  Current Outpatient Medications  Medication Sig Dispense Refill   bicalutamide (CASODEX) 50 MG tablet      calcium citrate-vitamin D (CITRACAL+D) 315-200 MG-UNIT tablet Take 1 tablet by mouth 2 (two) times daily.     esomeprazole (NEXIUM) 40 MG capsule Take 40 mg by mouth daily after breakfast.      No current facility-administered medications for this encounter.     REVIEW OF SYSTEMS:  On review of systems, the patient reports that he is doing well overall. He denies any chest pain, shortness of breath, cough, fevers, chills, night sweats, or unintended weight changes. He denies any bowel disturbances, and denies abdominal pain, nausea or vomiting. He denies any new musculoskeletal or joint aches or pains. His IPSS was 4, indicating mild urinary symptoms.  He reports that he has made significant progress in regaining bladder control, currently only using one pad per day for security with only rare, small volume leakage at this point.  His SHIM was 1, indicating he has severe erectile dysfunction following prostatectomy. A complete review of systems is obtained and is otherwise negative.  PHYSICAL EXAM:   Wt Readings from Last 3 Encounters:  02/02/19 165 lb (74.8 kg)  12/06/18 155 lb (70.3 kg)  11/29/18 155 lb (70.3 kg)   Temp Readings from Last 3 Encounters:  12/11/18 98 F (36.7 C) (Oral)  11/29/18 98.1 F (36.7 C) (Oral)  12/01/16 98.2 F (36.8 C) (Oral)   BP Readings from Last 3 Encounters:  12/11/18 124/84  11/29/18 (!) 147/89  12/01/16 (!) 147/103   Pulse Readings from Last 3 Encounters:  12/11/18 71  11/29/18 60  12/01/16 80   Pain Assessment Pain Score: 0-No pain/10 Physical exam deferred due to teleconference consult format.  LABORATORY DATA:  Lab Results  Component Value Date   WBC 9.0 12/09/2018   HGB 10.2 (L) 12/09/2018   HCT 32.2 (L) 12/09/2018   MCV  96.1 12/09/2018   PLT 182 12/09/2018   Lab Results  Component Value Date   NA 138 12/07/2018   K 3.9 12/07/2018   CL 103 12/07/2018   CO2 28 12/07/2018   Lab Results  Component Value Date   ALT 13 02/14/2012   AST 14 02/14/2012   ALKPHOS 67 02/14/2012   BILITOT 0.3 02/14/2012     RADIOGRAPHY: No results found.    IMPRESSION/PLAN: 1. 68 y.o. gentleman with detectable PSA of 1.33 s/p prostatectomy for Stage pT3b N1 adenocarcinoma of the prostate with unfavorable findings on pathology. This visit was conducted via telephone to spare the patient unnecessary potential exposure in the healthcare setting during the current COVID-19 pandemic. Today we reviewed the findings and workup thus far.  We discussed the natural history of high risk, locally advanced prostate cancer.  We reviewed the the implications of positive margins, extracapsular extension, lymph node and seminal vesicle involvement in addition to detectable, postoperative PSA indicating residual prostate cancer. We reviewed some of the evidence suggesting an advantage for patients who undergo salvage radiotherapy in the setting in terms of disease control and overall survival. We discussed radiation treatment directed to the prostatic fossa and  pelvic lymph nodes with regard to the logistics and delivery of external beam radiation treatment. The recommendation is to proceed with LT-ADT in combination with a 7.5 week course of daily radiotherapy.  In an effort to limit patient exposures during the COVID-19 pandemic, our recommendation is to continue on ADT with plans to move forward with radiotherapy in June or July 2020.  He and his wife were encouraged to ask questions that were answered to their stated satisfaction.  At the end of the conversation the patient is interested in moving forward with 7.5 weeks of external beam therapy to the prostate fossa and pelvic lymph nodes in combination with LT-ADT. He has received his first Lupron injection on 01/13/2019 and currently, we would anticipate moving forward with beginning radiation treatments in late June or early July 2020. He understands that the timing of starting his radiation treatments will depend on progress made in controlling the current COVID-19 pandemic which may further delay the start of treatment as we move forward.  We will remain in close communication with the patient to coordinate his CT simulation prior to treatment in the near future.  The patient appears to have a good understanding of his disease and our recommendations and is comfortable and in agreement with the stated plan.  He will keep his scheduled follow-up visit with Dr. Gloriann Loan on 02/14/2019 and we will share this information with Dr. Gloriann Loan and proceed with treatment planning accordingly.  Given current concerns for patient exposure during the COVID-19 pandemic, this encounter was conducted via telephone. The patient was notified in advance and was offered a [place] meeting to allow for face to face communication but unfortunately reported that he did not have the appropriate resources/technology to support such a visit and instead preferred to proceed with telephone consult. The patient has given verbal consent for this type of  encounter. The time spent during this encounter was 60 minutes with 50% of that time spent in the coordination of the patient's care. The attendants for this meeting include Tyler Pita MD, Ashlyn Bruning PA-C, patient Allen Tran and wife Allen Tran who serves as his interpreter as patient speaks limited [place] but understands English very well. During the encounter, Tyler Pita MD and Freeman Caldron PA-C were located at [facility]  Radiation Oncology Department.  Patient Allen Tran and wife, Allen Tran were located at home.     Nicholos Johns, PA-C    Tyler Pita, MD  [facility] Direct Dial: [phone]   Fax: [phone] [email or web address]   Skype   [email or web address]   This document serves as a record of services personally performed by Tyler Pita, MD and Freeman Caldron, PA-C. It was created on their behalf by Wilburn Mylar, a trained medical scribe. The creation of this record is based on the scribe's personal observations and the provider's statements to them. This document has been checked and approved by the attending provider.    References:  JAMA. 2006 Nov 15;296(19):2329-35.  Adjuvant radiotherapy for pathologically advanced prostate cancer: a randomized clinical trial.  Grandville Silos IM Jr(1), Tangen CM, Hettie Holstein MS, Ova Freshwater, Messing Wayland Denis ED.  Author information: (1)Department of Urology, [facility], [place], [address], [place] [address], [place]. [email or web address]  CONTEXT: Despite a stage-shift to earlier cancer stages and lower tumor volumes for prostate cancer, pathologically advanced disease is detected at radical prostatectomy in 38% to 52% of patients. However, the optimal management of these patients after radical prostatectomy is unknown.  OBJECTIVE: To determine whether adjuvant  radiotherapy improves metastasis-free survival in patients with stage pT3 N0 M0 prostate cancer.  DESIGN, SETTING, AND PATIENTS: Randomized, prospective, multi-institutional, [place] clinical trial with enrollment between June 25, 1987, and November 11, 1995 (with database frozen for statistical analysis on July 31, 2004). Patients were 81 men with pathologically advanced prostate cancer who had undergone radical prostatectomy. INTERVENTION: Men were randomly assigned to receive 60 to 64 Gy of external beam radiotherapy delivered to the prostatic fossa (n = 214) or usual care plus observation (n = 211).  MAIN OUTCOME MEASURES: Primary outcome was metastasis-free survival, defined as time to first occurrence of metastatic disease or death due to any cause. Secondary outcomes included prostate-specific antigen (PSA) relapse, recurrence-free survival, overall survival, freedom from hormonal therapy, and postoperative complications.  RESULTS: Among the 425 men, median follow-up was 10.6 years (interquartile range, 9.2-12.7 years). For metastasis-free survival, 76 (35.5%) of 214 men in the adjuvant radiotherapy group were diagnosed with metastatic disease or died (median metastasis-free estimate, 14.7 years), compared with 91 (43.1%) of 211 (median metastasis-free estimate, 13.2 years) of those in the observation group (hazard ratio [HR], 0.75; 95% CI, 0.55-1.02; P = .06). There were no significant between-group differences for overall survival (71 deaths, median survival of 14.7 years for radiotherapy vs 83 deaths, median survival of 13.8 years for observation; HR, 0.80; 95% CI, 0.58-1.09; P = .16). PSA relapse (median PSA relapse-free survival, 10.3 years for radiotherapy vs 3.1 years for observation; HR, 0.43; 95% CI, 0.31-0.58; P<.001) and disease recurrence (median recurrence-free survival, 13.8 years for radiotherapy vs 9.9 years for observation; HR, 0.62; 95% CI, 0.46-0.82; P = .001) were both significantly  reduced with radiotherapy. Adverse effects were more common with radiotherapy vs observation (23.8% vs 11.9%), including rectal complications (2.3% vs 0%), urethral strictures (17.8% vs 9.5%), and total urinary incontinence (6.5% vs 2.8%).  CONCLUSIONS: In men who had undergone radical prostatectomy for pathologically advanced prostate cancer, adjuvant radiotherapy resulted in significantly reduced risk of PSA relapse and disease recurrence, although the improvements in metastasis-free survival and overall survival were not statistically significant.  Trial Registration clinicaltrials.gov Identifier: [id number]. PMID: [id number]   J Clin Oncol.  2007 Jun 1;25(16):2225-9. Predominant treatment failure in postprostatectomy patients is local: analysis of patterns of treatment failure in SWOG 8794.  Swanson GP(1), [place] MA, Tangen CM, Chin J, Messing E, Canby-Hagino Daiva Eves, Doy Hutching ED;  Author information: (1)Department of Radiation Oncology and Urology, [facility], [place] [address], [place]. [email or web address] Comment in [place]. 2007 Dec 10;25(35):5671-2.  PURPOSE: [facility] (SWOG) trial [id number] demonstrated that adjuvant radiation reduces the risk of biochemical (prostate-specific antigen [PSA]) treatment failure by 50% over radical prostatectomy alone. In this analysis, we stratified patients as to their preradiation PSA levels and correlated it with outcomes such as PSA treatment failure, local recurrence, and distant failure, to serve as guidelines for future research.  PATIENTS AND METHODS: Four hundred thirty-one subjects with pathologically advanced prostate cancer (extraprostatic extension, positive surgical margins, or seminal vesicle invasion) were randomly assigned to adjuvant radiotherapy or observation.  RESULTS: Three hundred seventy-four eligible patients had immediate postprostatectomy and follow-up PSA data. Median  follow-up was 10.2 years. For patients with a postsurgical PSA of 0.2 ng/mL, radiation was associated with reductions in the 10-year risk of biochemical treatment failure (72% to 42%), local failures (20% to 7%), and distant failures (12% to 4%). For patients with a postsurgical PSA between higher than 0.2 and <or = 1.0 ng/mL, reductions in the 10-year risk of biochemical failure (80% to 73%), local failures (25% to 9%), and distant failures (16% to 12%) were realized. In patients with postsurgical PSA higher than 1.0, the respective findings were 94% versus 100%, 28% versus 9%, and 44% versus 18%.  CONCLUSION: The pattern of treatment failure in high-risk patients is predominantly local with a surprisingly low incidence of metastatic failure. Adjuvant radiation to the prostate bed reduces the risk of metastatic disease and biochemical failure at all postsurgical PSA levels. Further improvement in reducing local treatment failure is likely to have the greatest impact on outcome in high-risk patients after prostatectomy.  PMID: [id number]     J Urol. 2009 Mar;181(3):956-62.  Adjuvant radiotherapy for pathological T3N0M0 prostate cancer significantly reduces risk of metastases and improves survival: long-term followup of a randomized clinical trial.  Grandville Silos IM(1), Tangen CM, Hettie Holstein MS, Ova Freshwater, Messing Wayland Denis ED.  Chief Strategy Officer information: (1)[employer] [facility] [place], [place], [place], [place]. PURPOSE: Extraprostatic disease will be manifest in a third of men after radical prostatectomy. We present the long-term followup of a randomized clinical trial of radiotherapy to reduce the risk of subsequent metastatic disease and death.  MATERIALS AND METHODS: A total of 431 men with pT3N0M0 prostate cancer were randomized to 60 to 64 Gy adjuvant radiotherapy or observation. The primary study end point was  metastasis-free survival.  RESULTS: Of 425 eligible men 211 were randomized to observation and 214 to adjuvant radiation. Of those men under observation 70 ultimately received radiotherapy. Metastasis-free survival was significantly greater with radiotherapy (93 of 214 events on the radiotherapy arm vs 114 of 211 events on observation; HR 0.71; 95% CI 0.54, 0.94; p = 0.016). Survival improved significantly with adjuvant radiation (88 deaths of 214 on the radiotherapy arm vs 110 deaths of 211 on observation; HR 0.72; 95% CI 0.55, 0.96; p = 0.023).  CONCLUSIONS: Adjuvant radiotherapy after radical prostatectomy for a man with pT3N0M0 prostate cancer significantly reduces the risk of metastasis and increases survival.  PMCID: [id number] PMID: [id number]

## 2019-02-09 ENCOUNTER — Telehealth: Payer: Self-pay | Admitting: *Deleted

## 2019-02-09 NOTE — Telephone Encounter (Signed)
Called patient's wife- Jalene Mullet to inform of [facility] appt. and sim for 05-10-19, spoke with patient's wife- Jalene Mullet and she is aware of this appt.

## 2019-05-09 NOTE — Progress Notes (Signed)
  Radiation Oncology         [phone]) [phone] ________________________________  Name: Allen Tran MRN: [id number]  Date: 05/10/2019  DOB: 1951-09-12  SIMULATION AND TREATMENT PLANNING NOTE    ICD-10-CM   1. Prostate cancer ([place])  C61     DIAGNOSIS:  68 y.o. 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  NARRATIVE:  The patient was brought to the [facility].  Identity was confirmed.  All relevant records and images related to the planned course of therapy were reviewed.  The patient freely provided informed written consent to proceed with treatment after reviewing the details related to the planned course of therapy. The consent form was witnessed and verified by the simulation staff.  Then, the patient was set-up in a stable reproducible supine position for radiation therapy.  A vacuum lock pillow device was custom fabricated to position his legs in a reproducible immobilized position.  Then, I performed a urethrogram under sterile conditions to identify the prostatic bed.  CT images were obtained.  Surface markings were placed.  The CT images were loaded into the planning software.  Then the prostate bed target, pelvic lymph node target and avoidance structures including the rectum, bladder, bowel and hips were contoured.  Treatment planning then occurred.  The radiation prescription was entered and confirmed.  A total of one complex treatment devices were fabricated. I have requested : Intensity Modulated Radiotherapy (IMRT) is medically necessary for this case for the following reason:  Rectal sparing.Marland Kitchen  PLAN:  The patient will receive 45 Gy in 25 fractions of 1.8 Gy, followed by a boost to the prostate bed to a total dose of 68.4 Gy with 13 additional fractions of 1.8 Gy.   ________________________________  Sheral Apley Tammi Klippel, M.D.

## 2019-05-10 ENCOUNTER — Encounter: Payer: Self-pay | Admitting: Medical Oncology

## 2019-05-10 ENCOUNTER — Ambulatory Visit
Admission: RE | Admit: 2019-05-10 | Discharge: 2019-05-10 | Disposition: A | Discharge status: Home / Self Care | Payer: Commercial Managed Care - PPO | Source: Ambulatory Visit | Attending: Urology | Admitting: Urology

## 2019-05-10 ENCOUNTER — Ambulatory Visit
Admission: RE | Admit: 2019-05-10 | Discharge: 2019-05-10 | Disposition: A | Discharge status: Home / Self Care | Payer: Commercial Managed Care - PPO | Source: Ambulatory Visit | Attending: Radiation Oncology | Admitting: Radiation Oncology

## 2019-05-10 ENCOUNTER — Other Ambulatory Visit: Payer: Self-pay

## 2019-05-10 DIAGNOSIS — Z9889 Other specified postprocedural states: Secondary | ICD-10-CM | POA: Diagnosis not present

## 2019-05-10 DIAGNOSIS — Z79899 Other long term (current) drug therapy: Secondary | ICD-10-CM | POA: Insufficient documentation

## 2019-05-10 DIAGNOSIS — K219 Gastro-esophageal reflux disease without esophagitis: Secondary | ICD-10-CM | POA: Insufficient documentation

## 2019-05-10 DIAGNOSIS — C61 Malignant neoplasm of prostate: Secondary | ICD-10-CM | POA: Diagnosis not present

## 2019-05-10 NOTE — Progress Notes (Signed)
Radiation Oncology         [phone] ________________________________  Follow up New Visit  Name: Allen Tran MRN: [id number]  Date: 05/10/2019  DOB: Oct 25, 1951  BC:[id number], No Pcp Per  Lucas Mallow, MD   REFERRING PHYSICIAN: Lucas Mallow, MD  DIAGNOSIS: 68 y.o. 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    ICD-10-CM   1. Prostate cancer [facility])  C61     HISTORY OF PRESENT ILLNESS: Allen Tran is a 68 y.o. male diagnosed with Gleason 4+5 adenocarcinoma of the prostate in 09/2018 with PSA of 38. Disease staging was performed with CT A/P and bone scan on 10/01/2018 which were negative for visceral or osseous metastatic disease.  After discussion with his urologist, he elected to proceed with RALP with BPLND which was performed on 12/06/2018 with Dr. Gloriann Loan. The procedure was complicated by a small rectal injury that was repaired at the time of surgery. Final surgical pathology confirmed pT3bN1 disease with extracapsular extension, positive margins, bilateral seminal vesicle involvement lymphovascular involvement and 1 of 9 lymph nodes positive.  His initial postoperative PSA on 01/13/2019 remained detectable at 1.33.  He was started on ADT with a 3 month Lupron injection and a 2-week course of Casodex documented on 01/13/2019 at [facility].  So far, he reports tolerating the ADT very well without any significant fatigue or hot flashes.  He had a repeat PSA 04/15/19 which had decreased to 0.031, indicating a good response to ADT.  When we initially met to discuss salvage radiation in 01/2019, the patient was interested in proceeding but wished to postpone starting radiation until June/July due to COVID-19 pandemic.  He is now ready to begin radiation and presents today for further discussion of treatment and CT SIM/treatment planning. He is accompanied by his wife and a  Forensic scientist during our visit today.  PREVIOUS RADIATION THERAPY: No  PAST MEDICAL HISTORY:  Past Medical History:  Diagnosis Date   GERD (gastroesophageal reflux disease)    Kidney stone    Lower urinary tract symptoms (LUTS)    Prostate cancer ([place])       PAST SURGICAL HISTORY: Past Surgical History:  Procedure Laterality Date   CYSTO/ URETEROSCOPY LASER LITHOTRIPSY , BASKET STONE EXTRACTION/  URETERAL STENT PLACEMENT Right 08-08-2011    dr eskridge  '@[facility]'    CYSTOSCOPY W/ URETERAL STENT PLACEMENT Right 07-21-2011   dr eskridge  '@[facility]'    FINGER SURGERY Left 1990s   left thumb joint   PELVIC LYMPH NODE DISSECTION Bilateral 12/06/2018   Procedure: PELVIC LYMPH NODE DISSECTION;  Surgeon: Lucas Mallow, MD;  Location: [facility];  Service: Urology;  Laterality: Bilateral;   RECTAL EXAM UNDER ANESTHESIA N/A 12/06/2018   Procedure: ROBOTIC PRIMARY REPAIR OF RECTAL INJURY WITH PROCTOSCOPY.;  Surgeon: Stark Klein, MD;  Location: [facility];  Service: General;  Laterality: N/A;   ROBOT ASSISTED LAPAROSCOPIC RADICAL PROSTATECTOMY N/A 12/06/2018   Procedure: XI ROBOTIC ASSISTED LAPAROSCOPIC RADICAL PROSTATECTOMY;  Surgeon: Lucas Mallow, MD;  Location: [facility];  Service: Urology;  Laterality: N/A;    FAMILY HISTORY:  Family History  Problem Relation Age of Onset   Cancer Neg Hx    Prostate cancer Neg Hx    Breast cancer Neg Hx    Colon cancer Neg Hx    Pancreatic cancer Neg Hx     SOCIAL HISTORY:  Social History  Socioeconomic History   Marital status: Married    Spouse name: Not on file   Number of children: 2   Years of education: Not on file   Highest education level: Not on file  Occupational History    Comment: Games developer  Social Needs   Financial resource strain: Not on file   Food insecurity    Worry: Not on file    Inability: Not on file   Transportation needs    Medical: Not on file    Non-medical: Not on file  Tobacco Use    Smoking status: Never Smoker   Smokeless tobacco: Never Used  Substance and Sexual Activity   Alcohol use: No   Drug use: No   Sexual activity: Not Currently  Lifestyle   Physical activity    Days per week: Not on file    Minutes per session: Not on file   Stress: Not on file  Relationships   Social connections    Talks on phone: Not on file    Gets together: Not on file    Attends religious service: Not on file    Active member of club or organization: Not on file    Attends meetings of clubs or organizations: Not on file    Relationship status: Not on file   Intimate partner violence    Fear of current or ex partner: Not on file    Emotionally abused: Not on file    Physically abused: Not on file    Forced sexual activity: Not on file  Other Topics Concern   Not on file  Social History Narrative   Not on file    ALLERGIES: Patient has no known allergies.  MEDICATIONS:  Current Outpatient Medications  Medication Sig Dispense Refill   bicalutamide (CASODEX) 50 MG tablet      calcium citrate-vitamin D (CITRACAL+D) 315-200 MG-UNIT tablet Take 1 tablet by mouth 2 (two) times daily.     esomeprazole (NEXIUM) 40 MG capsule Take 40 mg by mouth daily after breakfast.      No current facility-administered medications for this encounter.     REVIEW OF SYSTEMS:  On review of systems, the patient reports that he is doing well overall. He denies any chest pain, shortness of breath, cough, fevers, chills, night sweats, or unintended weight changes. He denies any bowel disturbances, and denies abdominal pain, nausea or vomiting. He denies any new musculoskeletal or joint aches or pains. His IPSS was 4, indicating mild urinary symptoms.  He reports that he has made significant progress in regaining bladder control, currently only using one pad per day for security with only rare, small volume leakage at this point.  His SHIM was 1, indicating he has severe erectile dysfunction  following prostatectomy. A complete review of systems is obtained and is otherwise negative.  PHYSICAL EXAM:  Wt Readings from Last 3 Encounters:  02/02/19 165 lb (74.8 kg)  12/06/18 155 lb (70.3 kg)  11/29/18 155 lb (70.3 kg)   Temp Readings from Last 3 Encounters:  12/11/18 98 F (36.7 C) (Oral)  11/29/18 98.1 F (36.7 C) (Oral)  12/01/16 98.2 F (36.8 C) (Oral)   BP Readings from Last 3 Encounters:  12/11/18 124/84  11/29/18 (!) 147/89  12/01/16 (!) 147/103   Pulse Readings from Last 3 Encounters:  12/11/18 71  11/29/18 60  12/01/16 80    /10 In general this is a well appearing [place] male in no acute distress. He's alert and oriented x4  and appropriate throughout the examination. Cardiopulmonary assessment is negative for acute distress and he exhibits normal effort.    LABORATORY DATA:  Lab Results  Component Value Date   WBC 9.0 12/09/2018   HGB 10.2 (L) 12/09/2018   HCT 32.2 (L) 12/09/2018   MCV 96.1 12/09/2018   PLT 182 12/09/2018   Lab Results  Component Value Date   NA 138 12/07/2018   K 3.9 12/07/2018   CL 103 12/07/2018   CO2 28 12/07/2018   Lab Results  Component Value Date   ALT 13 02/14/2012   AST 14 02/14/2012   ALKPHOS 67 02/14/2012   BILITOT 0.3 02/14/2012     RADIOGRAPHY: No results found.    IMPRESSION/PLAN: 1. 68 y.o. gentleman with detectable PSA of 1.33 s/p prostatectomy for Stage pT3b N1 adenocarcinoma of the prostate with unfavorable findings on pathology. Today we reviewed the findings and workup thus far and the natural history of high risk, locally advanced prostate cancer.  We reviewed the the implications of positive margins, extracapsular extension, lymph node and seminal vesicle involvement in addition to detectable, postoperative PSA indicating residual prostate cancer. We reviewed some of the evidence suggesting an advantage for patients who undergo salvage radiotherapy in the setting in terms of disease control and overall  survival. We discussed radiation treatment directed to the prostatic fossa and pelvic lymph nodes with regard to the logistics and delivery of external beam radiation treatment. The recommendation is to proceed with LT-ADT in combination with a 7.5 week course of daily radiotherapy.  He and his wife were encouraged to ask questions that were answered to their stated satisfaction.  At the end of the conversation the patient is interested in moving forward with 7.5 weeks of external beam therapy to the prostate fossa and pelvic lymph nodes in combination with LT-ADT. He has received his first Lupron injection on 01/13/2019 and is ready to begin treatments. He has freely signed written consent to proceed today and a copy of this document has been placed in his chart.  The patient appears to have a good understanding of his disease and our recommendations and is comfortable and in agreement with the stated plan.     Nicholos Johns, PA-C    Tyler Pita, MD  [facility] Direct Dial: [phone]   Fax: [phone] [email or web address]   Skype   [email or web address]  References:  JAMA. 2006 Nov 15;296(19):2329-35.  Adjuvant radiotherapy for pathologically advanced prostate cancer: a randomized clinical trial.  Grandville Silos IM Jr(1), Tangen CM, Hettie Holstein MS, Ova Freshwater, Messing [facility].  Author information: (1)Department of Urology, [facility], [place], [address], [place] [address], [place]. [email or web address]  CONTEXT: Despite a stage-shift to earlier cancer stages and lower tumor volumes for prostate cancer, pathologically advanced disease is detected at radical prostatectomy in 38% to 52% of patients. However, the optimal management of these patients after radical prostatectomy is unknown.  OBJECTIVE: To determine whether adjuvant radiotherapy improves metastasis-free  survival in patients with stage pT3 N0 M0 prostate cancer.  DESIGN, SETTING, AND PATIENTS: Randomized, prospective, multi-institutional, [place] clinical trial with enrollment between June 25, 1987, and November 11, 1995 (with database frozen for statistical analysis on July 31, 2004). Patients were 68 men with pathologically advanced prostate cancer who had undergone radical prostatectomy. INTERVENTION: Men were randomly assigned to receive 60 to 64 Gy of  external beam radiotherapy delivered to the prostatic fossa (n = 214) or usual care plus observation (n = 211).  MAIN OUTCOME MEASURES: Primary outcome was metastasis-free survival, defined as time to first occurrence of metastatic disease or death due to any cause. Secondary outcomes included prostate-specific antigen (PSA) relapse, recurrence-free survival, overall survival, freedom from hormonal therapy, and postoperative complications.  RESULTS: Among the 425 men, median follow-up was 10.6 years (interquartile range, 9.2-12.7 years). For metastasis-free survival, 76 (35.5%) of 214 men in the adjuvant radiotherapy group were diagnosed with metastatic disease or died (median metastasis-free estimate, 14.7 years), compared with 91 (43.1%) of 211 (median metastasis-free estimate, 13.2 years) of those in the observation group (hazard ratio [HR], 0.75; 95% CI, 0.55-1.02; P = .06). There were no significant between-group differences for overall survival (71 deaths, median survival of 14.7 years for radiotherapy vs 83 deaths, median survival of 13.8 years for observation; HR, 0.80; 95% CI, 0.58-1.09; P = .16). PSA relapse (median PSA relapse-free survival, 10.3 years for radiotherapy vs 3.1 years for observation; HR, 0.43; 95% CI, 0.31-0.58; P<.001) and disease recurrence (median recurrence-free survival, 13.8 years for radiotherapy vs 9.9 years for observation; HR, 0.62; 95% CI, 0.46-0.82; P = .001) were both significantly reduced with radiotherapy. Adverse  effects were more common with radiotherapy vs observation (23.8% vs 11.9%), including rectal complications (5.3% vs 0%), urethral strictures (17.8% vs 9.5%), and total urinary incontinence (6.5% vs 2.8%).  CONCLUSIONS: In men who had undergone radical prostatectomy for pathologically advanced prostate cancer, adjuvant radiotherapy resulted in significantly reduced risk of PSA relapse and disease recurrence, although the improvements in metastasis-free survival and overall survival were not statistically significant.  Trial Registration clinicaltrials.gov Identifier: [id number]. PMID: [id number]   J Clin Oncol. 2007 Jun 1;25(16):2225-9. Predominant treatment failure in postprostatectomy patients is local: analysis of patterns of treatment failure in SWOG 8794.  Swanson GP(1), Nichols MA, Tangen CM, Chin J, Messing E, Canby-Hagino Daiva Eves, Doy Hutching ED;  Author information: (1)Department of Radiation Oncology and Urology, [facility], [place] [address], [place]. [email or web address] Comment in J Clin [place]. 2007 Dec 10;25(35):5671-2.  PURPOSE: [facility] (SWOG) trial [phone] demonstrated that adjuvant radiation reduces the risk of biochemical (prostate-specific antigen [PSA]) treatment failure by 50% over radical prostatectomy alone. In this analysis, we stratified patients as to their preradiation PSA levels and correlated it with outcomes such as PSA treatment failure, local recurrence, and distant failure, to serve as guidelines for future research.  PATIENTS AND METHODS: Four hundred thirty-one subjects with pathologically advanced prostate cancer (extraprostatic extension, positive surgical margins, or seminal vesicle invasion) were randomly assigned to adjuvant radiotherapy or observation.  RESULTS: Three hundred seventy-four eligible patients had immediate postprostatectomy and follow-up PSA data. Median follow-up was 10.2 years. For patients  with a postsurgical PSA of 0.2 ng/mL, radiation was associated with reductions in the 10-year risk of biochemical treatment failure (72% to 42%), local failures (20% to 7%), and distant failures (12% to 4%). For patients with a postsurgical PSA between higher than 0.2 and <or = 1.0 ng/mL, reductions in the 10-year risk of biochemical failure (80% to 73%), local failures (25% to 9%), and distant failures (16% to 12%) were realized. In patients with postsurgical PSA higher than 1.0, the respective findings were 94% versus 100%, 28% versus 9%, and 44% versus 18%.  CONCLUSION: The pattern of treatment failure in high-risk patients is predominantly local with a surprisingly low incidence of metastatic failure. Adjuvant radiation to the  prostate bed reduces the risk of metastatic disease and biochemical failure at all postsurgical PSA levels. Further improvement in reducing local treatment failure is likely to have the greatest impact on outcome in high-risk patients after prostatectomy.  PMID: [id number]     J Urol. 2009 Mar;181(3):956-62.  Adjuvant radiotherapy for pathological T3N0M0 prostate cancer significantly reduces risk of metastases and improves survival: long-term followup of a randomized clinical trial.  Grandville Silos IM(1), Tangen CM, Hettie Holstein MS, Ova Freshwater, Messing [facility].  Chief Strategy Officer information: (1)[employer] [facility] [place], [place], [place], [place]. PURPOSE: Extraprostatic disease will be manifest in a third of men after radical prostatectomy. We present the long-term followup of a randomized clinical trial of radiotherapy to reduce the risk of subsequent metastatic disease and death.  MATERIALS AND METHODS: A total of 431 men with pT3N0M0 prostate cancer were randomized to 60 to 64 Gy adjuvant radiotherapy or observation. The primary study end point was metastasis-free survival.  RESULTS: Of 425 eligible  men 211 were randomized to observation and 214 to adjuvant radiation. Of those men under observation 70 ultimately received radiotherapy. Metastasis-free survival was significantly greater with radiotherapy (93 of 214 events on the radiotherapy arm vs 114 of 211 events on observation; HR 0.71; 95% CI 0.54, 0.94; p = 0.016). Survival improved significantly with adjuvant radiation (88 deaths of 214 on the radiotherapy arm vs 110 deaths of 211 on observation; HR 0.72; 95% CI 0.55, 0.96; p = 0.023).  CONCLUSIONS: Adjuvant radiotherapy after radical prostatectomy for a man with pT3N0M0 prostate cancer significantly reduces the risk of metastasis and increases survival.  PMCID: [id number] PMID: [id number]

## 2019-05-12 DIAGNOSIS — Z79899 Other long term (current) drug therapy: Secondary | ICD-10-CM | POA: Diagnosis not present

## 2019-05-12 DIAGNOSIS — C61 Malignant neoplasm of prostate: Secondary | ICD-10-CM | POA: Insufficient documentation

## 2019-05-12 DIAGNOSIS — K219 Gastro-esophageal reflux disease without esophagitis: Secondary | ICD-10-CM | POA: Diagnosis not present

## 2019-05-12 DIAGNOSIS — Z9889 Other specified postprocedural states: Secondary | ICD-10-CM | POA: Insufficient documentation

## 2019-05-19 ENCOUNTER — Other Ambulatory Visit: Payer: Self-pay

## 2019-05-19 ENCOUNTER — Ambulatory Visit
Admission: RE | Admit: 2019-05-19 | Discharge: 2019-05-19 | Disposition: A | Discharge status: Home / Self Care | Payer: Commercial Managed Care - PPO | Source: Ambulatory Visit | Attending: Radiation Oncology | Admitting: Radiation Oncology

## 2019-05-19 DIAGNOSIS — C61 Malignant neoplasm of prostate: Secondary | ICD-10-CM | POA: Diagnosis not present

## 2019-05-20 ENCOUNTER — Ambulatory Visit
Admission: RE | Admit: 2019-05-20 | Discharge: 2019-05-20 | Disposition: A | Discharge status: Home / Self Care | Payer: Commercial Managed Care - PPO | Source: Ambulatory Visit | Attending: Radiation Oncology | Admitting: Radiation Oncology

## 2019-05-20 ENCOUNTER — Other Ambulatory Visit: Payer: Self-pay

## 2019-05-20 DIAGNOSIS — C61 Malignant neoplasm of prostate: Secondary | ICD-10-CM | POA: Diagnosis not present

## 2019-05-23 ENCOUNTER — Ambulatory Visit
Admission: RE | Admit: 2019-05-23 | Discharge: 2019-05-23 | Disposition: A | Discharge status: Home / Self Care | Payer: Commercial Managed Care - PPO | Source: Ambulatory Visit | Attending: Radiation Oncology | Admitting: Radiation Oncology

## 2019-05-23 ENCOUNTER — Other Ambulatory Visit: Payer: Self-pay

## 2019-05-23 DIAGNOSIS — C61 Malignant neoplasm of prostate: Secondary | ICD-10-CM | POA: Diagnosis not present

## 2019-05-24 ENCOUNTER — Other Ambulatory Visit: Payer: Self-pay

## 2019-05-24 ENCOUNTER — Ambulatory Visit
Admission: RE | Admit: 2019-05-24 | Discharge: 2019-05-24 | Disposition: A | Discharge status: Home / Self Care | Payer: Commercial Managed Care - PPO | Source: Ambulatory Visit | Attending: Radiation Oncology | Admitting: Radiation Oncology

## 2019-05-24 DIAGNOSIS — C61 Malignant neoplasm of prostate: Secondary | ICD-10-CM | POA: Diagnosis not present

## 2019-05-25 ENCOUNTER — Other Ambulatory Visit: Payer: Self-pay

## 2019-05-25 ENCOUNTER — Ambulatory Visit
Admission: RE | Admit: 2019-05-25 | Discharge: 2019-05-25 | Disposition: A | Discharge status: Home / Self Care | Payer: Commercial Managed Care - PPO | Source: Ambulatory Visit | Attending: Radiation Oncology | Admitting: Radiation Oncology

## 2019-05-25 DIAGNOSIS — C61 Malignant neoplasm of prostate: Secondary | ICD-10-CM | POA: Diagnosis not present

## 2019-05-26 ENCOUNTER — Other Ambulatory Visit: Payer: Self-pay

## 2019-05-26 ENCOUNTER — Ambulatory Visit
Admission: RE | Admit: 2019-05-26 | Discharge: 2019-05-26 | Disposition: A | Discharge status: Home / Self Care | Payer: Commercial Managed Care - PPO | Source: Ambulatory Visit | Attending: Radiation Oncology | Admitting: Radiation Oncology

## 2019-05-26 DIAGNOSIS — C61 Malignant neoplasm of prostate: Secondary | ICD-10-CM | POA: Diagnosis not present

## 2019-05-27 ENCOUNTER — Ambulatory Visit
Admission: RE | Admit: 2019-05-27 | Discharge: 2019-05-27 | Disposition: A | Discharge status: Home / Self Care | Payer: Commercial Managed Care - PPO | Source: Ambulatory Visit | Attending: Radiation Oncology | Admitting: Radiation Oncology

## 2019-05-27 ENCOUNTER — Other Ambulatory Visit: Payer: Self-pay

## 2019-05-27 DIAGNOSIS — C61 Malignant neoplasm of prostate: Secondary | ICD-10-CM | POA: Diagnosis not present

## 2019-05-30 ENCOUNTER — Ambulatory Visit
Admission: RE | Admit: 2019-05-30 | Discharge: 2019-05-30 | Disposition: A | Discharge status: Home / Self Care | Payer: Commercial Managed Care - PPO | Source: Ambulatory Visit | Attending: Radiation Oncology | Admitting: Radiation Oncology

## 2019-05-30 ENCOUNTER — Other Ambulatory Visit: Payer: Self-pay

## 2019-05-30 DIAGNOSIS — C61 Malignant neoplasm of prostate: Secondary | ICD-10-CM | POA: Diagnosis not present

## 2019-05-31 ENCOUNTER — Other Ambulatory Visit: Payer: Self-pay

## 2019-05-31 ENCOUNTER — Ambulatory Visit
Admission: RE | Admit: 2019-05-31 | Discharge: 2019-05-31 | Disposition: A | Discharge status: Home / Self Care | Payer: Commercial Managed Care - PPO | Source: Ambulatory Visit | Attending: Radiation Oncology | Admitting: Radiation Oncology

## 2019-05-31 DIAGNOSIS — C61 Malignant neoplasm of prostate: Secondary | ICD-10-CM | POA: Diagnosis not present

## 2019-06-01 ENCOUNTER — Ambulatory Visit
Admission: RE | Admit: 2019-06-01 | Discharge: 2019-06-01 | Disposition: A | Discharge status: Home / Self Care | Payer: Commercial Managed Care - PPO | Source: Ambulatory Visit | Attending: Radiation Oncology | Admitting: Radiation Oncology

## 2019-06-01 ENCOUNTER — Other Ambulatory Visit: Payer: Self-pay

## 2019-06-01 DIAGNOSIS — C61 Malignant neoplasm of prostate: Secondary | ICD-10-CM | POA: Diagnosis not present

## 2019-06-02 ENCOUNTER — Ambulatory Visit
Admission: RE | Admit: 2019-06-02 | Discharge: 2019-06-02 | Disposition: A | Discharge status: Home / Self Care | Payer: Commercial Managed Care - PPO | Source: Ambulatory Visit | Attending: Radiation Oncology | Admitting: Radiation Oncology

## 2019-06-02 ENCOUNTER — Other Ambulatory Visit: Payer: Self-pay

## 2019-06-02 DIAGNOSIS — C61 Malignant neoplasm of prostate: Secondary | ICD-10-CM | POA: Diagnosis not present

## 2019-06-03 ENCOUNTER — Other Ambulatory Visit: Payer: Self-pay

## 2019-06-03 ENCOUNTER — Ambulatory Visit
Admission: RE | Admit: 2019-06-03 | Discharge: 2019-06-03 | Disposition: A | Discharge status: Home / Self Care | Payer: Commercial Managed Care - PPO | Source: Ambulatory Visit | Attending: Radiation Oncology | Admitting: Radiation Oncology

## 2019-06-03 DIAGNOSIS — C61 Malignant neoplasm of prostate: Secondary | ICD-10-CM | POA: Diagnosis not present

## 2019-06-06 ENCOUNTER — Ambulatory Visit
Admission: RE | Admit: 2019-06-06 | Discharge: 2019-06-06 | Disposition: A | Discharge status: Home / Self Care | Payer: Commercial Managed Care - PPO | Source: Ambulatory Visit | Attending: Radiation Oncology | Admitting: Radiation Oncology

## 2019-06-06 ENCOUNTER — Other Ambulatory Visit: Payer: Self-pay

## 2019-06-06 DIAGNOSIS — C61 Malignant neoplasm of prostate: Secondary | ICD-10-CM | POA: Diagnosis not present

## 2019-06-07 ENCOUNTER — Ambulatory Visit
Admission: RE | Admit: 2019-06-07 | Discharge: 2019-06-07 | Disposition: A | Discharge status: Home / Self Care | Payer: Commercial Managed Care - PPO | Source: Ambulatory Visit | Attending: Radiation Oncology | Admitting: Radiation Oncology

## 2019-06-07 ENCOUNTER — Other Ambulatory Visit: Payer: Self-pay

## 2019-06-07 DIAGNOSIS — C61 Malignant neoplasm of prostate: Secondary | ICD-10-CM | POA: Diagnosis not present

## 2019-06-08 ENCOUNTER — Other Ambulatory Visit: Payer: Self-pay

## 2019-06-08 ENCOUNTER — Ambulatory Visit
Admission: RE | Admit: 2019-06-08 | Discharge: 2019-06-08 | Disposition: A | Discharge status: Home / Self Care | Payer: Commercial Managed Care - PPO | Source: Ambulatory Visit | Attending: Radiation Oncology | Admitting: Radiation Oncology

## 2019-06-08 DIAGNOSIS — C61 Malignant neoplasm of prostate: Secondary | ICD-10-CM | POA: Diagnosis not present

## 2019-06-09 ENCOUNTER — Other Ambulatory Visit: Payer: Self-pay

## 2019-06-09 ENCOUNTER — Ambulatory Visit
Admission: RE | Admit: 2019-06-09 | Discharge: 2019-06-09 | Disposition: A | Discharge status: Home / Self Care | Payer: Commercial Managed Care - PPO | Source: Ambulatory Visit | Attending: Radiation Oncology | Admitting: Radiation Oncology

## 2019-06-09 DIAGNOSIS — C61 Malignant neoplasm of prostate: Secondary | ICD-10-CM | POA: Diagnosis not present

## 2019-06-10 ENCOUNTER — Ambulatory Visit
Admission: RE | Admit: 2019-06-10 | Discharge: 2019-06-10 | Disposition: A | Discharge status: Home / Self Care | Payer: Commercial Managed Care - PPO | Source: Ambulatory Visit | Attending: Radiation Oncology | Admitting: Radiation Oncology

## 2019-06-10 ENCOUNTER — Other Ambulatory Visit: Payer: Self-pay

## 2019-06-10 DIAGNOSIS — C61 Malignant neoplasm of prostate: Secondary | ICD-10-CM | POA: Diagnosis not present

## 2019-06-13 ENCOUNTER — Ambulatory Visit
Admission: RE | Admit: 2019-06-13 | Discharge: 2019-06-13 | Disposition: A | Discharge status: Home / Self Care | Payer: Commercial Managed Care - PPO | Source: Ambulatory Visit | Attending: Radiation Oncology | Admitting: Radiation Oncology

## 2019-06-13 ENCOUNTER — Other Ambulatory Visit: Payer: Self-pay

## 2019-06-13 DIAGNOSIS — C61 Malignant neoplasm of prostate: Secondary | ICD-10-CM | POA: Insufficient documentation

## 2019-06-13 DIAGNOSIS — Z9889 Other specified postprocedural states: Secondary | ICD-10-CM | POA: Insufficient documentation

## 2019-06-13 DIAGNOSIS — Z79899 Other long term (current) drug therapy: Secondary | ICD-10-CM | POA: Diagnosis not present

## 2019-06-13 DIAGNOSIS — K219 Gastro-esophageal reflux disease without esophagitis: Secondary | ICD-10-CM | POA: Diagnosis not present

## 2019-06-14 ENCOUNTER — Ambulatory Visit
Admission: RE | Admit: 2019-06-14 | Discharge: 2019-06-14 | Disposition: A | Discharge status: Home / Self Care | Payer: Commercial Managed Care - PPO | Source: Ambulatory Visit | Attending: Radiation Oncology | Admitting: Radiation Oncology

## 2019-06-14 ENCOUNTER — Other Ambulatory Visit: Payer: Self-pay

## 2019-06-14 DIAGNOSIS — C61 Malignant neoplasm of prostate: Secondary | ICD-10-CM | POA: Diagnosis not present

## 2019-06-15 ENCOUNTER — Ambulatory Visit
Admission: RE | Admit: 2019-06-15 | Discharge: 2019-06-15 | Disposition: A | Discharge status: Home / Self Care | Payer: Commercial Managed Care - PPO | Source: Ambulatory Visit | Attending: Radiation Oncology | Admitting: Radiation Oncology

## 2019-06-15 ENCOUNTER — Other Ambulatory Visit: Payer: Self-pay

## 2019-06-15 DIAGNOSIS — C61 Malignant neoplasm of prostate: Secondary | ICD-10-CM | POA: Diagnosis not present

## 2019-06-16 ENCOUNTER — Ambulatory Visit
Admission: RE | Admit: 2019-06-16 | Discharge: 2019-06-16 | Disposition: A | Discharge status: Home / Self Care | Payer: Commercial Managed Care - PPO | Source: Ambulatory Visit | Attending: Radiation Oncology | Admitting: Radiation Oncology

## 2019-06-16 ENCOUNTER — Other Ambulatory Visit: Payer: Self-pay

## 2019-06-16 DIAGNOSIS — C61 Malignant neoplasm of prostate: Secondary | ICD-10-CM | POA: Diagnosis not present

## 2019-06-17 ENCOUNTER — Ambulatory Visit
Admission: RE | Admit: 2019-06-17 | Discharge: 2019-06-17 | Disposition: A | Discharge status: Home / Self Care | Payer: Commercial Managed Care - PPO | Source: Ambulatory Visit | Attending: Radiation Oncology | Admitting: Radiation Oncology

## 2019-06-17 DIAGNOSIS — C61 Malignant neoplasm of prostate: Secondary | ICD-10-CM | POA: Diagnosis not present

## 2019-06-20 ENCOUNTER — Ambulatory Visit
Admission: RE | Admit: 2019-06-20 | Discharge: 2019-06-20 | Disposition: A | Discharge status: Home / Self Care | Payer: Commercial Managed Care - PPO | Source: Ambulatory Visit | Attending: Radiation Oncology | Admitting: Radiation Oncology

## 2019-06-20 ENCOUNTER — Other Ambulatory Visit: Payer: Self-pay

## 2019-06-20 DIAGNOSIS — C61 Malignant neoplasm of prostate: Secondary | ICD-10-CM | POA: Diagnosis not present

## 2019-06-21 ENCOUNTER — Ambulatory Visit
Admission: RE | Admit: 2019-06-21 | Discharge: 2019-06-21 | Disposition: A | Discharge status: Home / Self Care | Payer: Commercial Managed Care - PPO | Source: Ambulatory Visit | Attending: Radiation Oncology | Admitting: Radiation Oncology

## 2019-06-21 DIAGNOSIS — C61 Malignant neoplasm of prostate: Secondary | ICD-10-CM | POA: Diagnosis not present

## 2019-06-22 ENCOUNTER — Ambulatory Visit
Admission: RE | Admit: 2019-06-22 | Discharge: 2019-06-22 | Disposition: A | Discharge status: Home / Self Care | Payer: Commercial Managed Care - PPO | Source: Ambulatory Visit | Attending: Radiation Oncology | Admitting: Radiation Oncology

## 2019-06-22 ENCOUNTER — Other Ambulatory Visit: Payer: Self-pay

## 2019-06-22 DIAGNOSIS — C61 Malignant neoplasm of prostate: Secondary | ICD-10-CM | POA: Diagnosis not present

## 2019-06-23 ENCOUNTER — Ambulatory Visit
Admission: RE | Admit: 2019-06-23 | Discharge: 2019-06-23 | Disposition: A | Discharge status: Home / Self Care | Payer: Commercial Managed Care - PPO | Source: Ambulatory Visit | Attending: Radiation Oncology | Admitting: Radiation Oncology

## 2019-06-23 ENCOUNTER — Other Ambulatory Visit: Payer: Self-pay

## 2019-06-23 DIAGNOSIS — C61 Malignant neoplasm of prostate: Secondary | ICD-10-CM | POA: Diagnosis not present

## 2019-06-24 ENCOUNTER — Ambulatory Visit
Admission: RE | Admit: 2019-06-24 | Discharge: 2019-06-24 | Disposition: A | Discharge status: Home / Self Care | Payer: Commercial Managed Care - PPO | Source: Ambulatory Visit | Attending: Radiation Oncology | Admitting: Radiation Oncology

## 2019-06-24 ENCOUNTER — Other Ambulatory Visit: Payer: Self-pay

## 2019-06-24 DIAGNOSIS — C61 Malignant neoplasm of prostate: Secondary | ICD-10-CM | POA: Diagnosis not present

## 2019-06-27 ENCOUNTER — Other Ambulatory Visit: Payer: Self-pay

## 2019-06-27 ENCOUNTER — Ambulatory Visit
Admission: RE | Admit: 2019-06-27 | Discharge: 2019-06-27 | Disposition: A | Discharge status: Home / Self Care | Payer: Commercial Managed Care - PPO | Source: Ambulatory Visit | Attending: Radiation Oncology | Admitting: Radiation Oncology

## 2019-06-27 DIAGNOSIS — C61 Malignant neoplasm of prostate: Secondary | ICD-10-CM | POA: Diagnosis not present

## 2019-06-28 ENCOUNTER — Other Ambulatory Visit: Payer: Self-pay

## 2019-06-28 ENCOUNTER — Ambulatory Visit
Admission: RE | Admit: 2019-06-28 | Discharge: 2019-06-28 | Disposition: A | Discharge status: Home / Self Care | Payer: Commercial Managed Care - PPO | Source: Ambulatory Visit | Attending: Radiation Oncology | Admitting: Radiation Oncology

## 2019-06-28 DIAGNOSIS — C61 Malignant neoplasm of prostate: Secondary | ICD-10-CM | POA: Diagnosis not present

## 2019-06-29 ENCOUNTER — Ambulatory Visit
Admission: RE | Admit: 2019-06-29 | Discharge: 2019-06-29 | Disposition: A | Discharge status: Home / Self Care | Payer: Commercial Managed Care - PPO | Source: Ambulatory Visit | Attending: Radiation Oncology | Admitting: Radiation Oncology

## 2019-06-29 ENCOUNTER — Other Ambulatory Visit: Payer: Self-pay

## 2019-06-29 DIAGNOSIS — C61 Malignant neoplasm of prostate: Secondary | ICD-10-CM | POA: Diagnosis not present

## 2019-06-30 ENCOUNTER — Ambulatory Visit
Admission: RE | Admit: 2019-06-30 | Discharge: 2019-06-30 | Disposition: A | Discharge status: Home / Self Care | Payer: Commercial Managed Care - PPO | Source: Ambulatory Visit | Attending: Radiation Oncology | Admitting: Radiation Oncology

## 2019-06-30 ENCOUNTER — Other Ambulatory Visit: Payer: Self-pay

## 2019-06-30 DIAGNOSIS — C61 Malignant neoplasm of prostate: Secondary | ICD-10-CM | POA: Diagnosis not present

## 2019-07-01 ENCOUNTER — Other Ambulatory Visit: Payer: Self-pay

## 2019-07-01 ENCOUNTER — Ambulatory Visit
Admission: RE | Admit: 2019-07-01 | Discharge: 2019-07-01 | Disposition: A | Discharge status: Home / Self Care | Payer: Commercial Managed Care - PPO | Source: Ambulatory Visit | Attending: Radiation Oncology | Admitting: Radiation Oncology

## 2019-07-01 DIAGNOSIS — C61 Malignant neoplasm of prostate: Secondary | ICD-10-CM | POA: Diagnosis not present

## 2019-07-04 ENCOUNTER — Other Ambulatory Visit: Payer: Self-pay

## 2019-07-04 ENCOUNTER — Ambulatory Visit
Admission: RE | Admit: 2019-07-04 | Discharge: 2019-07-04 | Disposition: A | Discharge status: Home / Self Care | Payer: Commercial Managed Care - PPO | Source: Ambulatory Visit | Attending: Radiation Oncology | Admitting: Radiation Oncology

## 2019-07-04 DIAGNOSIS — C61 Malignant neoplasm of prostate: Secondary | ICD-10-CM | POA: Diagnosis not present

## 2019-07-05 ENCOUNTER — Other Ambulatory Visit: Payer: Self-pay

## 2019-07-05 ENCOUNTER — Ambulatory Visit
Admission: RE | Admit: 2019-07-05 | Discharge: 2019-07-05 | Disposition: A | Discharge status: Home / Self Care | Payer: Commercial Managed Care - PPO | Source: Ambulatory Visit | Attending: Radiation Oncology | Admitting: Radiation Oncology

## 2019-07-05 DIAGNOSIS — C61 Malignant neoplasm of prostate: Secondary | ICD-10-CM | POA: Diagnosis not present

## 2019-07-06 ENCOUNTER — Other Ambulatory Visit: Payer: Self-pay

## 2019-07-06 ENCOUNTER — Ambulatory Visit
Admission: RE | Admit: 2019-07-06 | Discharge: 2019-07-06 | Disposition: A | Discharge status: Home / Self Care | Payer: Commercial Managed Care - PPO | Source: Ambulatory Visit | Attending: Radiation Oncology | Admitting: Radiation Oncology

## 2019-07-06 DIAGNOSIS — C61 Malignant neoplasm of prostate: Secondary | ICD-10-CM | POA: Diagnosis not present

## 2019-07-07 ENCOUNTER — Ambulatory Visit
Admission: RE | Admit: 2019-07-07 | Discharge: 2019-07-07 | Disposition: A | Discharge status: Home / Self Care | Payer: Commercial Managed Care - PPO | Source: Ambulatory Visit | Attending: Radiation Oncology | Admitting: Radiation Oncology

## 2019-07-07 ENCOUNTER — Other Ambulatory Visit: Payer: Self-pay

## 2019-07-07 DIAGNOSIS — C61 Malignant neoplasm of prostate: Secondary | ICD-10-CM | POA: Diagnosis not present

## 2019-07-08 ENCOUNTER — Ambulatory Visit
Admission: RE | Admit: 2019-07-08 | Discharge: 2019-07-08 | Disposition: A | Discharge status: Home / Self Care | Payer: Commercial Managed Care - PPO | Source: Ambulatory Visit | Attending: Radiation Oncology | Admitting: Radiation Oncology

## 2019-07-08 ENCOUNTER — Other Ambulatory Visit: Payer: Self-pay

## 2019-07-08 DIAGNOSIS — C61 Malignant neoplasm of prostate: Secondary | ICD-10-CM | POA: Diagnosis not present

## 2019-07-11 ENCOUNTER — Ambulatory Visit: Payer: Commercial Managed Care - PPO

## 2019-07-11 ENCOUNTER — Ambulatory Visit
Admission: RE | Admit: 2019-07-11 | Discharge: 2019-07-11 | Disposition: A | Discharge status: Home / Self Care | Payer: Commercial Managed Care - PPO | Source: Ambulatory Visit | Attending: Radiation Oncology | Admitting: Radiation Oncology

## 2019-07-11 ENCOUNTER — Encounter: Payer: Self-pay | Admitting: Radiation Oncology

## 2019-07-11 ENCOUNTER — Other Ambulatory Visit: Payer: Self-pay

## 2019-07-11 DIAGNOSIS — C61 Malignant neoplasm of prostate: Secondary | ICD-10-CM | POA: Diagnosis not present

## 2019-07-12 ENCOUNTER — Ambulatory Visit: Payer: Commercial Managed Care - PPO

## 2019-07-13 ENCOUNTER — Ambulatory Visit: Payer: Commercial Managed Care - PPO

## 2019-07-15 NOTE — Progress Notes (Signed)
  Radiation Oncology         [id number]) [phone] ________________________________  Name: Camila Thrasher MRN: [id number]  Date: 07/11/2019  DOB: [birth year]  End of Treatment Note  Diagnosis:   68 y.o. 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     Indication for treatment:  Curative, Definitive Radiotherapy       Radiation treatment dates:   05/19/19-07/11/19  Site/dose:  1. The prostate fossa and pelvic lymph nodes were initially treated to 45 Gy in 25 fractions of 1.8 Gy  2. The prostate fossa only was boosted to 68.4 Gy with 13 additional fractions of 1.8 Gy   Beams/energy:  1. The prostate fossa  and pelvic lymph nodes were initially treated using VMAT intensity modulated radiotherapy delivering 6 megavolt photons. Image guidance was performed with CB-CT studies prior to each fraction. He was immobilized with a body fix lower extremity mold.  2. The prostate fossa only was boosted using VMAT intensity modulated radiotherapy delivering 6 megavolt photons. Image guidance was performed with CB-CT studies prior to each fraction. He was immobilized with a body fix lower extremity mold.  Narrative: The patient tolerated radiation treatment relatively well.   The patient experienced some minor urinary irritation and modest fatigue.    Plan: The patient has completed radiation treatment. He will return to radiation oncology clinic for routine followup in one month. I advised him to call or return sooner if he has any questions or concerns related to his recovery or treatment. ________________________________  Sheral Apley. Tammi Klippel, M.D.

## 2019-08-17 ENCOUNTER — Ambulatory Visit
Admission: RE | Admit: 2019-08-17 | Discharge: 2019-08-17 | Disposition: A | Discharge status: Home / Self Care | Payer: Commercial Managed Care - PPO | Source: Ambulatory Visit | Attending: Urology | Admitting: Urology

## 2019-08-17 ENCOUNTER — Other Ambulatory Visit: Payer: Self-pay

## 2019-08-17 DIAGNOSIS — C61 Malignant neoplasm of prostate: Secondary | ICD-10-CM

## 2019-08-18 NOTE — Progress Notes (Signed)
Radiation Oncology         [phone]) [phone] ________________________________  Name: Allen Tran MRN: [id number]  Date: 08/17/2019  DOB: Aug 23, 1951  Post Treatment Note  CC: Patient, No Pcp Per  Lucas Mallow, MD  Diagnosis:    68 y.o. 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     Interval Since Last Radiation:  5 weeks, concurrent with ADT 05/19/19-07/11/19: 1. The prostate fossa and pelvic lymph nodes were initially treated to 45 Gy in 25 fractions of 1.8 Gy  2. The prostate fossa only was boosted to 68.4 Gy with 13 additional fractions of 1.8 Gy   Narrative: I spoke with the patient, through his wife who serves as his interpreter, to conduct his routine scheduled 1 month follow up visit via telephone to spare the patient unnecessary potential exposure in the healthcare setting during the current COVID-19 pandemic.  The patient was notified in advance and gave permission to proceed with this visit format. He tolerated radiation treatment relatively well with only minor urinary irritation and modest fatigue.                            On review of systems, the patient states that he is doing very well overall.  He reports almost complete resolution of his lower urinary tract symptoms and is quite pleased with his progress to date.  He specifically denies dysuria, gross hematuria, excessive daytime frequency, incomplete bladder emptying or incontinence.  His bowel habits have returned to baseline and he specifically denies abdominal pain, nausea, vomiting, diarrhea or constipation.  He reports a healthy appetite and is maintaining his weight.  He is continued to tolerate the ADT well despite occasional hot flashes and fatigue.  ALLERGIES:  has No Known Allergies.  Meds: Current Outpatient Medications  Medication Sig Dispense Refill  . bicalutamide (CASODEX) 50 MG tablet     .  calcium citrate-vitamin D (CITRACAL+D) 315-200 MG-UNIT tablet Take 1 tablet by mouth 2 (two) times daily.    Marland Kitchen esomeprazole (NEXIUM) 40 MG capsule Take 40 mg by mouth daily after breakfast.     . enalapril (VASOTEC) 10 MG tablet Take 10 mg by mouth 1 day or 1 dose. Take when bp is elevated    . hydrochlorothiazide (HYDRODIURIL) 25 MG tablet Take 25 mg by mouth daily. Takes when bp is elevated     No current facility-administered medications for this encounter.     Physical Findings:  vitals were not taken for this visit.   /Unable to assess due to telephone follow-up visit format.  Lab Findings: Lab Results  Component Value Date   WBC 9.0 12/09/2018   HGB 10.2 (L) 12/09/2018   HCT 32.2 (L) 12/09/2018   MCV 96.1 12/09/2018   PLT 182 12/09/2018     Radiographic Findings: No results found.  Impression/Plan: 1.  68 y.o. 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   He will continue to follow up with urology for ongoing PSA determinations and has an appointment scheduled with Dr. Gloriann Loan in the near future for repeat ADT.  He anticipates completing a full 2-year course of ADT under the care and direction of Dr. Gloriann Loan.  He understands what to expect with regards to PSA monitoring going forward. I will look forward  to following his response to treatment via correspondence with urology, and would be happy to continue to participate in his care if clinically indicated. I talked to the patient about what to expect in the future, including his risk for erectile dysfunction and rectal bleeding. I encouraged him to call or return to the office if he has any questions regarding his previous radiation or possible radiation side effects. He was comfortable with this plan and will follow up as needed.     Nicholos Johns, PA-C

## 2020-11-16 ENCOUNTER — Other Ambulatory Visit (HOSPITAL_COMMUNITY): Payer: Self-pay | Admitting: Nurse Practitioner

## 2020-11-16 DIAGNOSIS — C61 Malignant neoplasm of prostate: Secondary | ICD-10-CM

## 2020-11-16 DIAGNOSIS — R9721 Rising PSA following treatment for malignant neoplasm of prostate: Secondary | ICD-10-CM

## 2020-11-16 DIAGNOSIS — R590 Localized enlarged lymph nodes: Secondary | ICD-10-CM

## 2020-11-30 ENCOUNTER — Ambulatory Visit (HOSPITAL_COMMUNITY): Admission: RE | Admit: 2020-11-30 | Payer: Medicare Other | Source: Ambulatory Visit

## 2020-12-31 ENCOUNTER — Ambulatory Visit (HOSPITAL_COMMUNITY)
Admission: RE | Admit: 2020-12-31 | Discharge: 2020-12-31 | Disposition: A | Discharge status: Home / Self Care | Payer: Commercial Managed Care - PPO | Source: Ambulatory Visit | Attending: Nurse Practitioner | Admitting: Nurse Practitioner

## 2020-12-31 ENCOUNTER — Other Ambulatory Visit: Payer: Self-pay

## 2020-12-31 DIAGNOSIS — R590 Localized enlarged lymph nodes: Secondary | ICD-10-CM | POA: Insufficient documentation

## 2020-12-31 DIAGNOSIS — C61 Malignant neoplasm of prostate: Secondary | ICD-10-CM | POA: Diagnosis present

## 2020-12-31 DIAGNOSIS — R9721 Rising PSA following treatment for malignant neoplasm of prostate: Secondary | ICD-10-CM | POA: Diagnosis present

## 2020-12-31 MED ORDER — PIFLIFOLASTAT F 18 (PYLARIFY) INJECTION
9.0000 | Freq: Once | INTRAVENOUS | Status: AC
  Administered 2020-12-31: 9.3 via INTRAVENOUS

## 2022-01-27 ENCOUNTER — Other Ambulatory Visit (HOSPITAL_COMMUNITY): Payer: Self-pay | Admitting: Urology

## 2022-01-27 DIAGNOSIS — C61 Malignant neoplasm of prostate: Secondary | ICD-10-CM

## 2022-02-14 ENCOUNTER — Encounter (HOSPITAL_COMMUNITY)
Admission: RE | Admit: 2022-02-14 | Discharge: 2022-02-14 | Disposition: A | Discharge status: Home / Self Care | Payer: Medicare Other | Source: Ambulatory Visit | Attending: Urology | Admitting: Urology

## 2022-02-14 DIAGNOSIS — C61 Malignant neoplasm of prostate: Secondary | ICD-10-CM | POA: Diagnosis not present

## 2022-02-14 MED ORDER — PIFLIFOLASTAT F 18 (PYLARIFY) INJECTION
9.0000 | Freq: Once | INTRAVENOUS | Status: AC
  Administered 2022-02-14: 9.9 via INTRAVENOUS

## 2022-10-29 IMAGING — CT NM PET TUM IMG SKULL BASE T - THIGH
1 of 7 series · 1 of 25 positions shown · non-contrast
Comparison: PSMA PET scan 12/31/2020

CLINICAL DATA: Prostate carcinoma with biochemical recurrence.
Prostatectomy 2626

EXAM:
NUCLEAR MEDICINE PET SKULL BASE TO THIGH
TECHNIQUE: 9.9 mCi F18 Piflufolastat (Pylarify) was injected intravenously.
Full-ring PET imaging was performed from the skull base to thigh
after the radiotracer. CT data was obtained and used for attenuation
correction and anatomic localization.

[Series 4: ct sk_thigh 5.0 bf37 · axial · 5.0mm · 0.98mm/px · 1 of 258 slices shown]
[im 206/258  brain]
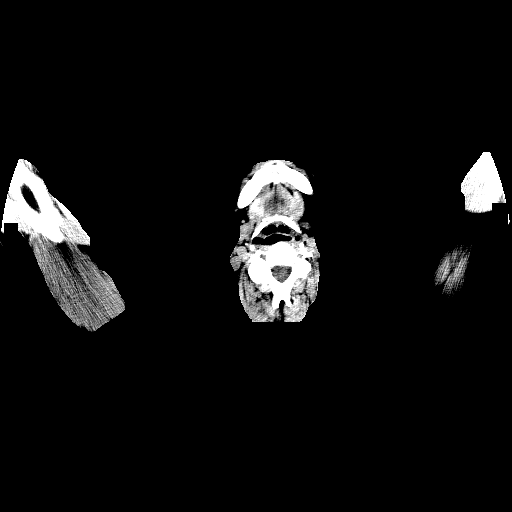

[1 of 25 positions shown; findings below may reference images not displayed]

FINDINGS: NECK

No radiotracer activity in neck lymph nodes.

Incidental CT finding: Opacification maxillary sinus consistent with
chronic sinus inflammation.

CHEST

No radiotracer accumulation within mediastinal or hilar lymph nodes.
No suspicious pulmonary nodules on the CT scan.

Incidental CT finding: None

ABDOMEN/PELVIS

Prostate: No focal activity in the prostate bed.

Focus radiotracer activity inferior and slightly posterior to the
bladder corresponds most closely to the rectal wall anteriorly with
SUV max equal 8.8 on image 222. No focal lesion identified on
noncontrast exam.

Lymph nodes: No abnormal radiotracer accumulation within pelvic or
abdominal nodes.

Liver: No evidence of liver metastasis

Incidental CT finding: None

SKELETON

No focal  activity to suggest skeletal metastasis.
IMPRESSION: 1. Focus radiotracer activity localizing to the anterior wall the
rectum just below the bladder is nonspecific. Otherwise no evidence
of local recurrence of prostate carcinoma in the prostatectomy bed.
2. No evidence metastatic adenopathy in the pelvis or periaortic
retroperitoneum.
3. No evidence of visceral metastasis or skeletal metastasis.
